# Patient Record
Sex: Male | Born: 1965 | Race: White | Hispanic: No | Marital: Married | Smoking: Current some day smoker
Health system: Southern US, Community
[De-identification: ages and names within clinical notes are randomized; demographics above are authoritative.]

## PROBLEM LIST (undated history)

## (undated) DIAGNOSIS — I10 Essential (primary) hypertension: Secondary | ICD-10-CM

## (undated) DIAGNOSIS — I5032 Chronic diastolic (congestive) heart failure: Secondary | ICD-10-CM

## (undated) DIAGNOSIS — Z9889 Other specified postprocedural states: Principal | ICD-10-CM

## (undated) DIAGNOSIS — G4733 Obstructive sleep apnea (adult) (pediatric): Secondary | ICD-10-CM

## (undated) DIAGNOSIS — E785 Hyperlipidemia, unspecified: Secondary | ICD-10-CM

## (undated) DIAGNOSIS — E119 Type 2 diabetes mellitus without complications: Secondary | ICD-10-CM

## (undated) DIAGNOSIS — I313 Pericardial effusion (noninflammatory): Secondary | ICD-10-CM

## (undated) HISTORY — PX: HERNIA REPAIR: SHX51

## (undated) HISTORY — DX: Chronic diastolic (congestive) heart failure: I50.32

## (undated) HISTORY — DX: Other specified postprocedural states: Z98.890

---

## 2018-12-07 ENCOUNTER — Inpatient Hospital Stay (HOSPITAL_COMMUNITY): Payer: 59

## 2018-12-07 ENCOUNTER — Inpatient Hospital Stay (HOSPITAL_COMMUNITY): Payer: 59 | Admitting: Certified Registered Nurse Anesthetist

## 2018-12-07 ENCOUNTER — Encounter (HOSPITAL_COMMUNITY): Payer: Self-pay | Admitting: Emergency Medicine

## 2018-12-07 ENCOUNTER — Other Ambulatory Visit: Payer: Self-pay

## 2018-12-07 ENCOUNTER — Encounter (HOSPITAL_COMMUNITY)
Admission: EM | Disposition: A | Discharge status: Home / Self Care | Payer: Self-pay | Source: Home / Self Care | Attending: Cardiology

## 2018-12-07 ENCOUNTER — Inpatient Hospital Stay (HOSPITAL_COMMUNITY)
Admission: EM | Admit: 2018-12-07 | Discharge: 2018-12-10 | DRG: 271 | Disposition: A | Discharge status: Home / Self Care | Payer: 59 | Attending: Cardiology | Admitting: Cardiology

## 2018-12-07 DIAGNOSIS — R079 Chest pain, unspecified: Secondary | ICD-10-CM | POA: Diagnosis present

## 2018-12-07 DIAGNOSIS — R0789 Other chest pain: Secondary | ICD-10-CM | POA: Diagnosis present

## 2018-12-07 DIAGNOSIS — E1165 Type 2 diabetes mellitus with hyperglycemia: Secondary | ICD-10-CM | POA: Diagnosis not present

## 2018-12-07 DIAGNOSIS — I2119 ST elevation (STEMI) myocardial infarction involving other coronary artery of inferior wall: Secondary | ICD-10-CM

## 2018-12-07 DIAGNOSIS — I10 Essential (primary) hypertension: Secondary | ICD-10-CM

## 2018-12-07 DIAGNOSIS — I2510 Atherosclerotic heart disease of native coronary artery without angina pectoris: Secondary | ICD-10-CM | POA: Diagnosis present

## 2018-12-07 DIAGNOSIS — I110 Hypertensive heart disease with heart failure: Secondary | ICD-10-CM | POA: Diagnosis present

## 2018-12-07 DIAGNOSIS — I5032 Chronic diastolic (congestive) heart failure: Secondary | ICD-10-CM | POA: Diagnosis present

## 2018-12-07 DIAGNOSIS — Z881 Allergy status to other antibiotic agents status: Secondary | ICD-10-CM

## 2018-12-07 DIAGNOSIS — E669 Obesity, unspecified: Secondary | ICD-10-CM | POA: Diagnosis present

## 2018-12-07 DIAGNOSIS — I314 Cardiac tamponade: Secondary | ICD-10-CM | POA: Diagnosis present

## 2018-12-07 DIAGNOSIS — I3139 Other pericardial effusion (noninflammatory): Secondary | ICD-10-CM

## 2018-12-07 DIAGNOSIS — I313 Pericardial effusion (noninflammatory): Secondary | ICD-10-CM | POA: Diagnosis present

## 2018-12-07 DIAGNOSIS — J9811 Atelectasis: Secondary | ICD-10-CM

## 2018-12-07 DIAGNOSIS — G4733 Obstructive sleep apnea (adult) (pediatric): Secondary | ICD-10-CM | POA: Diagnosis present

## 2018-12-07 DIAGNOSIS — Z79899 Other long term (current) drug therapy: Secondary | ICD-10-CM | POA: Diagnosis not present

## 2018-12-07 DIAGNOSIS — E7800 Pure hypercholesterolemia, unspecified: Secondary | ICD-10-CM

## 2018-12-07 DIAGNOSIS — Z888 Allergy status to other drugs, medicaments and biological substances status: Secondary | ICD-10-CM | POA: Diagnosis not present

## 2018-12-07 DIAGNOSIS — F17200 Nicotine dependence, unspecified, uncomplicated: Secondary | ICD-10-CM | POA: Diagnosis present

## 2018-12-07 DIAGNOSIS — Z6841 Body Mass Index (BMI) 40.0 and over, adult: Secondary | ICD-10-CM | POA: Diagnosis not present

## 2018-12-07 DIAGNOSIS — Z9889 Other specified postprocedural states: Secondary | ICD-10-CM | POA: Insufficient documentation

## 2018-12-07 DIAGNOSIS — E785 Hyperlipidemia, unspecified: Secondary | ICD-10-CM | POA: Diagnosis present

## 2018-12-07 DIAGNOSIS — D72829 Elevated white blood cell count, unspecified: Secondary | ICD-10-CM | POA: Diagnosis present

## 2018-12-07 DIAGNOSIS — E1169 Type 2 diabetes mellitus with other specified complication: Secondary | ICD-10-CM | POA: Diagnosis not present

## 2018-12-07 DIAGNOSIS — Z794 Long term (current) use of insulin: Secondary | ICD-10-CM | POA: Diagnosis not present

## 2018-12-07 DIAGNOSIS — I308 Other forms of acute pericarditis: Secondary | ICD-10-CM | POA: Diagnosis not present

## 2018-12-07 DIAGNOSIS — R072 Precordial pain: Secondary | ICD-10-CM | POA: Diagnosis not present

## 2018-12-07 HISTORY — PX: TEE WITHOUT CARDIOVERSION: SHX5443

## 2018-12-07 HISTORY — DX: Type 2 diabetes mellitus without complications: E11.9

## 2018-12-07 HISTORY — DX: Obstructive sleep apnea (adult) (pediatric): G47.33

## 2018-12-07 HISTORY — PX: LEFT HEART CATH AND CORONARY ANGIOGRAPHY: CATH118249

## 2018-12-07 HISTORY — DX: Hyperlipidemia, unspecified: E78.5

## 2018-12-07 HISTORY — PX: SUBXYPHOID PERICARDIAL WINDOW: SHX5075

## 2018-12-07 HISTORY — DX: Pericardial effusion (noninflammatory): I31.3

## 2018-12-07 HISTORY — DX: Other specified postprocedural states: Z98.890

## 2018-12-07 HISTORY — DX: Other pericardial effusion (noninflammatory): I31.39

## 2018-12-07 HISTORY — DX: Essential (primary) hypertension: I10

## 2018-12-07 HISTORY — DX: Morbid (severe) obesity due to excess calories: E66.01

## 2018-12-07 LAB — POCT I-STAT 3, ART BLOOD GAS (G3+)
Acid-Base Excess: 2 mmol/L (ref 0.0–2.0)
Acid-Base Excess: 1 mmol/L (ref 0.0–2.0)
Bicarbonate: 30 mmol/L — ABNORMAL HIGH (ref 20.0–28.0)
Bicarbonate: 29.5 mmol/L — ABNORMAL HIGH (ref 20.0–28.0)
O2 Saturation: 94 %
O2 Saturation: 91 %
Patient temperature: 98.6
Patient temperature: 98.6
TCO2: 32 mmol/L (ref 22–32)
TCO2: 31 mmol/L (ref 22–32)
pCO2 arterial: 61.4 mmHg — ABNORMAL HIGH (ref 32.0–48.0)
pCO2 arterial: 61 mmHg — ABNORMAL HIGH (ref 32.0–48.0)
pH, Arterial: 7.298 — ABNORMAL LOW (ref 7.350–7.450)
pH, Arterial: 7.292 — ABNORMAL LOW (ref 7.350–7.450)
pO2, Arterial: 80 mmHg — ABNORMAL LOW (ref 83.0–108.0)
pO2, Arterial: 69 mmHg — ABNORMAL LOW (ref 83.0–108.0)

## 2018-12-07 LAB — CBC WITH DIFFERENTIAL/PLATELET
Abs Immature Granulocytes: 0.11 10*3/uL — ABNORMAL HIGH (ref 0.00–0.07)
Basophils Absolute: 0 10*3/uL (ref 0.0–0.1)
Basophils Relative: 0 %
Eosinophils Absolute: 0.2 10*3/uL (ref 0.0–0.5)
Eosinophils Relative: 1 %
HCT: 53.5 % — ABNORMAL HIGH (ref 39.0–52.0)
Hemoglobin: 16.8 g/dL (ref 13.0–17.0)
Immature Granulocytes: 1 %
LYMPHS PCT: 11 %
Lymphs Abs: 1.9 10*3/uL (ref 0.7–4.0)
MCH: 28 pg (ref 26.0–34.0)
MCHC: 31.4 g/dL (ref 30.0–36.0)
MCV: 89.2 fL (ref 80.0–100.0)
Monocytes Absolute: 1.5 10*3/uL — ABNORMAL HIGH (ref 0.1–1.0)
Monocytes Relative: 9 %
Neutro Abs: 13.4 10*3/uL — ABNORMAL HIGH (ref 1.7–7.7)
Neutrophils Relative %: 78 %
Platelets: 237 10*3/uL (ref 150–400)
RBC: 6 MIL/uL — ABNORMAL HIGH (ref 4.22–5.81)
RDW: 15.1 % (ref 11.5–15.5)
WBC: 17.2 10*3/uL — ABNORMAL HIGH (ref 4.0–10.5)
nRBC: 0 % (ref 0.0–0.2)

## 2018-12-07 LAB — BODY FLUID CELL COUNT WITH DIFFERENTIAL
Lymphs, Fluid: 4 %
Monocyte-Macrophage-Serous Fluid: 4 % — ABNORMAL LOW (ref 50–90)
Neutrophil Count, Fluid: 92 % — ABNORMAL HIGH (ref 0–25)
Total Nucleated Cell Count, Fluid: UNDETERMINED cu mm (ref 0–1000)

## 2018-12-07 LAB — I-STAT TROPONIN, ED: Troponin i, poc: 0 ng/mL (ref 0.00–0.08)

## 2018-12-07 LAB — COMPREHENSIVE METABOLIC PANEL
ALT: 26 U/L (ref 0–44)
AST: 25 U/L (ref 15–41)
Albumin: 4.4 g/dL (ref 3.5–5.0)
Alkaline Phosphatase: 49 U/L (ref 38–126)
Anion gap: 12 (ref 5–15)
BUN: 16 mg/dL (ref 6–20)
CO2: 32 mmol/L (ref 22–32)
Calcium: 9.4 mg/dL (ref 8.9–10.3)
Chloride: 96 mmol/L — ABNORMAL LOW (ref 98–111)
Creatinine, Ser: 0.82 mg/dL (ref 0.61–1.24)
GFR calc Af Amer: >60 mL/min (ref >60)
GFR calc non Af Amer: >60 mL/min (ref >60)
Glucose, Bld: 104 mg/dL — ABNORMAL HIGH (ref 70–99)
POTASSIUM: 3.7 mmol/L (ref 3.5–5.1)
SODIUM: 140 mmol/L (ref 135–145)
Total Bilirubin: 0.7 mg/dL (ref 0.3–1.2)
Total Protein: 7.7 g/dL (ref 6.5–8.1)

## 2018-12-07 LAB — PROTIME-INR

## 2018-12-07 LAB — LIPID PANEL
Cholesterol: 145 mg/dL (ref 0–200)
HDL: 42 mg/dL (ref >40)
LDL Cholesterol: 79 mg/dL (ref 0–99)
TRIGLYCERIDES: 120 mg/dL (ref <150)
Total CHOL/HDL Ratio: 3.5 RATIO
VLDL: 24 mg/dL (ref 0–40)

## 2018-12-07 LAB — APTT

## 2018-12-07 LAB — GLUCOSE, CAPILLARY
Glucose-Capillary: 168 mg/dL — ABNORMAL HIGH (ref 70–99)
Glucose-Capillary: 195 mg/dL — ABNORMAL HIGH (ref 70–99)
Glucose-Capillary: 215 mg/dL — ABNORMAL HIGH (ref 70–99)
Glucose-Capillary: 302 mg/dL — ABNORMAL HIGH (ref 70–99)
Glucose-Capillary: 315 mg/dL — ABNORMAL HIGH (ref 70–99)

## 2018-12-07 LAB — ECHOCARDIOGRAM COMPLETE
Height: 69 in
Weight: 6400 oz

## 2018-12-07 LAB — POCT I-STAT, CHEM 8
BUN: 15 mg/dL (ref 6–20)
Calcium, Ion: 1.17 mmol/L (ref 1.15–1.40)
Chloride: 96 mmol/L — ABNORMAL LOW (ref 98–111)
Creatinine, Ser: 0.6 mg/dL — ABNORMAL LOW (ref 0.61–1.24)
Glucose, Bld: 141 mg/dL — ABNORMAL HIGH (ref 70–99)
HEMATOCRIT: 51 % (ref 39.0–52.0)
Hemoglobin: 17.3 g/dL — ABNORMAL HIGH (ref 13.0–17.0)
Potassium: 3.8 mmol/L (ref 3.5–5.1)
Sodium: 138 mmol/L (ref 135–145)
TCO2: 32 mmol/L (ref 22–32)

## 2018-12-07 LAB — TSH: TSH: 2.28 u[IU]/mL (ref 0.350–4.500)

## 2018-12-07 LAB — TROPONIN I
Troponin I: <0.03 ng/mL (ref <0.03)
Troponin I: <0.03 ng/mL (ref <0.03)
Troponin I: <0.03 ng/mL (ref <0.03)

## 2018-12-07 LAB — SEDIMENTATION RATE: Sed Rate: 10 mm/hr (ref 0–16)

## 2018-12-07 LAB — C-REACTIVE PROTEIN: CRP: 0.8 mg/dL (ref <1.0)

## 2018-12-07 LAB — HIV ANTIBODY (ROUTINE TESTING W REFLEX): HIV Screen 4th Generation wRfx: NONREACTIVE

## 2018-12-07 LAB — PREPARE RBC (CROSSMATCH)

## 2018-12-07 LAB — LIPASE, BLOOD: Lipase: 28 U/L (ref 11–51)

## 2018-12-07 LAB — ABO/RH: ABO/RH(D): O POS

## 2018-12-07 LAB — HEMOGLOBIN A1C
Hgb A1c MFr Bld: 7.2 % — ABNORMAL HIGH (ref 4.8–5.6)
Mean Plasma Glucose: 159.94 mg/dL

## 2018-12-07 LAB — BRAIN NATRIURETIC PEPTIDE: B Natriuretic Peptide: 11.9 pg/mL (ref 0.0–100.0)

## 2018-12-07 LAB — MRSA PCR SCREENING: MRSA by PCR: NEGATIVE

## 2018-12-07 SURGERY — CREATION, PERICARDIAL WINDOW, SUBXIPHOID APPROACH
Anesthesia: General | Site: Chest

## 2018-12-07 SURGERY — LEFT HEART CATH AND CORONARY ANGIOGRAPHY
Anesthesia: LOCAL

## 2018-12-07 MED ORDER — HEPARIN SODIUM (PORCINE) 1000 UNIT/ML IJ SOLN
INTRAMUSCULAR | Status: AC
  Filled 2018-12-07: qty 1

## 2018-12-07 MED ORDER — SUCCINYLCHOLINE CHLORIDE 20 MG/ML IJ SOLN
INTRAMUSCULAR | Status: DC | PRN
  Administered 2018-12-07: 140 mg via INTRAVENOUS

## 2018-12-07 MED ORDER — LIDOCAINE HCL (PF) 1 % IJ SOLN
INTRAMUSCULAR | Status: AC
  Filled 2018-12-07: qty 30

## 2018-12-07 MED ORDER — LIDOCAINE HCL (PF) 1 % IJ SOLN
INTRAMUSCULAR | Status: DC | PRN
  Administered 2018-12-07: 2 mL via SUBCUTANEOUS

## 2018-12-07 MED ORDER — ROCURONIUM BROMIDE 50 MG/5ML IV SOSY
PREFILLED_SYRINGE | INTRAVENOUS | Status: AC
  Filled 2018-12-07: qty 20

## 2018-12-07 MED ORDER — ENOXAPARIN SODIUM 100 MG/ML ~~LOC~~ SOLN: 90.0000 mg | Freq: Every day | SUBCUTANEOUS | Status: DC

## 2018-12-07 MED ORDER — LABETALOL HCL 5 MG/ML IV SOLN
INTRAVENOUS | Status: DC | PRN
  Administered 2018-12-07 (×3): 5 mg via INTRAVENOUS

## 2018-12-07 MED ORDER — HEPARIN (PORCINE) IN NACL 1000-0.9 UT/500ML-% IV SOLN
INTRAVENOUS | Status: DC | PRN
  Administered 2018-12-07 (×2): 500 mL

## 2018-12-07 MED ORDER — POTASSIUM CHLORIDE 10 MEQ/50ML IV SOLN: 10.0000 meq | Freq: Every day | INTRAVENOUS | Status: DC | PRN

## 2018-12-07 MED ORDER — ROCURONIUM BROMIDE 100 MG/10ML IV SOLN
INTRAVENOUS | Status: DC | PRN
  Administered 2018-12-07 (×2): 20 mg via INTRAVENOUS

## 2018-12-07 MED ORDER — ACETAMINOPHEN 325 MG PO TABS
650.0000 mg | ORAL_TABLET | ORAL | Status: DC | PRN
  Administered 2018-12-07: 650 mg via ORAL
  Filled 2018-12-07: qty 2

## 2018-12-07 MED ORDER — ALBUTEROL SULFATE (2.5 MG/3ML) 0.083% IN NEBU
2.5000 mg | INHALATION_SOLUTION | Freq: Four times a day (QID) | RESPIRATORY_TRACT | Status: DC | PRN
  Administered 2018-12-07: 2.5 mg via RESPIRATORY_TRACT

## 2018-12-07 MED ORDER — SODIUM CHLORIDE 0.9 % IV SOLN
INTRAVENOUS | Status: AC | PRN
  Administered 2018-12-07: 100 mL/h via INTRAVENOUS

## 2018-12-07 MED ORDER — SODIUM CHLORIDE 0.9 % IV SOLN: 250.0000 mL | INTRAVENOUS | Status: DC | PRN

## 2018-12-07 MED ORDER — ONDANSETRON HCL 4 MG/2ML IJ SOLN
INTRAMUSCULAR | Status: AC
  Filled 2018-12-07: qty 2

## 2018-12-07 MED ORDER — DEXAMETHASONE SODIUM PHOSPHATE 10 MG/ML IJ SOLN
INTRAMUSCULAR | Status: AC
  Filled 2018-12-07: qty 1

## 2018-12-07 MED ORDER — ONDANSETRON HCL 4 MG/2ML IJ SOLN
4.0000 mg | Freq: Once | INTRAMUSCULAR | Status: AC
  Administered 2018-12-07: 4 mg via INTRAVENOUS
  Filled 2018-12-07: qty 2

## 2018-12-07 MED ORDER — VERAPAMIL HCL 2.5 MG/ML IV SOLN
INTRAVENOUS | Status: DC | PRN
  Administered 2018-12-07: 10 mL via INTRA_ARTERIAL

## 2018-12-07 MED ORDER — ROSUVASTATIN CALCIUM 5 MG PO TABS: 10.0000 mg | ORAL_TABLET | Freq: Every evening | ORAL | Status: DC

## 2018-12-07 MED ORDER — ONDANSETRON HCL 4 MG/2ML IJ SOLN: 4.0000 mg | Freq: Four times a day (QID) | INTRAMUSCULAR | Status: DC | PRN

## 2018-12-07 MED ORDER — LACTATED RINGERS IV SOLN
INTRAVENOUS | Status: DC | PRN
  Administered 2018-12-07 (×2): via INTRAVENOUS

## 2018-12-07 MED ORDER — MORPHINE SULFATE (PF) 2 MG/ML IV SOLN
2.0000 mg | INTRAVENOUS | Status: DC | PRN
  Administered 2018-12-07 (×2): 2 mg via INTRAVENOUS
  Filled 2018-12-07 (×2): qty 1

## 2018-12-07 MED ORDER — HEPARIN SODIUM (PORCINE) 1000 UNIT/ML IJ SOLN
INTRAMUSCULAR | Status: DC | PRN
  Administered 2018-12-07: 5000 [IU] via INTRAVENOUS

## 2018-12-07 MED ORDER — IOPAMIDOL (ISOVUE-370) INJECTION 76%
100.0000 mL | Freq: Once | INTRAVENOUS | Status: AC
  Administered 2018-12-07: 100 mL via INTRAVENOUS

## 2018-12-07 MED ORDER — PROPOFOL 10 MG/ML IV BOLUS
INTRAVENOUS | Status: AC
  Filled 2018-12-07: qty 20

## 2018-12-07 MED ORDER — ALBUTEROL SULFATE (2.5 MG/3ML) 0.083% IN NEBU: 3.0000 mL | INHALATION_SOLUTION | Freq: Four times a day (QID) | RESPIRATORY_TRACT | Status: DC | PRN

## 2018-12-07 MED ORDER — MIDAZOLAM HCL 2 MG/2ML IJ SOLN
INTRAMUSCULAR | Status: AC
  Filled 2018-12-07: qty 2

## 2018-12-07 MED ORDER — CARISOPRODOL 350 MG PO TABS: 350.0000 mg | ORAL_TABLET | Freq: Two times a day (BID) | ORAL | Status: DC | PRN

## 2018-12-07 MED ORDER — ADULT MULTIVITAMIN W/MINERALS CH: 1.0000 | ORAL_TABLET | Freq: Every day | ORAL | Status: DC

## 2018-12-07 MED ORDER — CEFAZOLIN SODIUM-DEXTROSE 2-4 GM/100ML-% IV SOLN
2.0000 g | Freq: Three times a day (TID) | INTRAVENOUS | Status: AC
  Administered 2018-12-07 – 2018-12-08 (×2): 2 g via INTRAVENOUS
  Filled 2018-12-07 (×2): qty 100

## 2018-12-07 MED ORDER — ENOXAPARIN SODIUM 40 MG/0.4ML ~~LOC~~ SOLN: 40.0000 mg | SUBCUTANEOUS | Status: DC

## 2018-12-07 MED ORDER — ORAL CARE MOUTH RINSE: 15.0000 mL | Freq: Two times a day (BID) | OROMUCOSAL | Status: DC

## 2018-12-07 MED ORDER — PROPOFOL 10 MG/ML IV BOLUS
INTRAVENOUS | Status: DC | PRN
  Administered 2018-12-07: 80 mg via INTRAVENOUS

## 2018-12-07 MED ORDER — ACETAMINOPHEN 160 MG/5ML PO SOLN: 1000.0000 mg | Freq: Four times a day (QID) | ORAL | Status: DC

## 2018-12-07 MED ORDER — PHENYLEPHRINE 40 MCG/ML (10ML) SYRINGE FOR IV PUSH (FOR BLOOD PRESSURE SUPPORT)
PREFILLED_SYRINGE | INTRAVENOUS | Status: AC
  Filled 2018-12-07: qty 10

## 2018-12-07 MED ORDER — FENTANYL CITRATE (PF) 100 MCG/2ML IJ SOLN
INTRAMUSCULAR | Status: DC | PRN
  Administered 2018-12-07: 50 ug via INTRAVENOUS
  Administered 2018-12-07: 100 ug via INTRAVENOUS
  Administered 2018-12-07: 50 ug via INTRAVENOUS

## 2018-12-07 MED ORDER — 0.9 % SODIUM CHLORIDE (POUR BTL) OPTIME
TOPICAL | Status: DC | PRN
  Administered 2018-12-07: 2000 mL

## 2018-12-07 MED ORDER — INSULIN ASPART 100 UNIT/ML ~~LOC~~ SOLN
SUBCUTANEOUS | Status: AC
  Filled 2018-12-07: qty 1

## 2018-12-07 MED ORDER — SODIUM CHLORIDE 0.9% FLUSH
3.0000 mL | Freq: Two times a day (BID) | INTRAVENOUS | Status: DC
  Administered 2018-12-07: 3 mL via INTRAVENOUS

## 2018-12-07 MED ORDER — EPHEDRINE SULFATE 50 MG/ML IJ SOLN
INTRAMUSCULAR | Status: DC | PRN
  Administered 2018-12-07: 5 mg via INTRAVENOUS

## 2018-12-07 MED ORDER — PANTOPRAZOLE SODIUM 40 MG PO TBEC
40.0000 mg | DELAYED_RELEASE_TABLET | Freq: Every day | ORAL | Status: DC
  Administered 2018-12-07 – 2018-12-10 (×4): 40 mg via ORAL
  Filled 2018-12-07 (×4): qty 1

## 2018-12-07 MED ORDER — INSULIN ASPART 100 UNIT/ML ~~LOC~~ SOLN: 5.0000 [IU] | Freq: Once | SUBCUTANEOUS | Status: DC

## 2018-12-07 MED ORDER — SENNOSIDES-DOCUSATE SODIUM 8.6-50 MG PO TABS
1.0000 | ORAL_TABLET | Freq: Every day | ORAL | Status: DC
  Administered 2018-12-07 – 2018-12-08 (×2): 1 via ORAL
  Filled 2018-12-07 (×2): qty 1

## 2018-12-07 MED ORDER — ASPIRIN 81 MG PO CHEW
81.0000 mg | CHEWABLE_TABLET | Freq: Every day | ORAL | Status: DC
  Filled 2018-12-07: qty 1

## 2018-12-07 MED ORDER — SUGAMMADEX SODIUM 500 MG/5ML IV SOLN
INTRAVENOUS | Status: DC | PRN
  Administered 2018-12-07: 400 mg via INTRAVENOUS

## 2018-12-07 MED ORDER — SUGAMMADEX SODIUM 500 MG/5ML IV SOLN
INTRAVENOUS | Status: AC
  Filled 2018-12-07: qty 5

## 2018-12-07 MED ORDER — SODIUM CHLORIDE 0.9% IV SOLUTION: Freq: Once | INTRAVENOUS | Status: DC

## 2018-12-07 MED ORDER — ALBUTEROL SULFATE (2.5 MG/3ML) 0.083% IN NEBU
2.5000 mg | INHALATION_SOLUTION | RESPIRATORY_TRACT | Status: DC
  Administered 2018-12-07 – 2018-12-08 (×3): 2.5 mg via RESPIRATORY_TRACT
  Filled 2018-12-07 (×2): qty 3

## 2018-12-07 MED ORDER — OXYCODONE HCL 5 MG PO TABS
5.0000 mg | ORAL_TABLET | ORAL | Status: DC | PRN
  Administered 2018-12-07: 10 mg via ORAL
  Administered 2018-12-08 – 2018-12-09 (×2): 5 mg via ORAL
  Filled 2018-12-07 (×2): qty 1
  Filled 2018-12-07: qty 2

## 2018-12-07 MED ORDER — FENTANYL CITRATE (PF) 250 MCG/5ML IJ SOLN
INTRAMUSCULAR | Status: AC
  Filled 2018-12-07: qty 5

## 2018-12-07 MED ORDER — KETOROLAC TROMETHAMINE 30 MG/ML IJ SOLN
30.0000 mg | Freq: Four times a day (QID) | INTRAMUSCULAR | Status: DC
  Administered 2018-12-07: 30 mg via INTRAVENOUS
  Filled 2018-12-07: qty 1

## 2018-12-07 MED ORDER — SODIUM CHLORIDE 0.9 % IV SOLN
INTRAVENOUS | Status: DC
  Administered 2018-12-07: 08:00:00 via INTRAVENOUS

## 2018-12-07 MED ORDER — ROCURONIUM BROMIDE 50 MG/5ML IV SOSY
PREFILLED_SYRINGE | INTRAVENOUS | Status: AC
  Filled 2018-12-07: qty 5

## 2018-12-07 MED ORDER — BISACODYL 5 MG PO TBEC
10.0000 mg | DELAYED_RELEASE_TABLET | Freq: Every day | ORAL | Status: DC
  Administered 2018-12-08 – 2018-12-09 (×2): 10 mg via ORAL
  Filled 2018-12-07 (×3): qty 2

## 2018-12-07 MED ORDER — ACETAMINOPHEN 500 MG PO TABS
1000.0000 mg | ORAL_TABLET | Freq: Four times a day (QID) | ORAL | Status: DC
  Administered 2018-12-07 – 2018-12-08 (×2): 1000 mg via ORAL
  Filled 2018-12-07 (×2): qty 2

## 2018-12-07 MED ORDER — FENTANYL CITRATE (PF) 100 MCG/2ML IJ SOLN
50.0000 ug | Freq: Once | INTRAMUSCULAR | Status: AC
  Administered 2018-12-07: 50 ug via INTRAVENOUS
  Filled 2018-12-07: qty 2

## 2018-12-07 MED ORDER — ASPIRIN 81 MG PO CHEW
324.0000 mg | CHEWABLE_TABLET | Freq: Once | ORAL | Status: AC
  Administered 2018-12-07: 324 mg via ORAL
  Filled 2018-12-07: qty 4

## 2018-12-07 MED ORDER — SODIUM CHLORIDE 0.9 % IV SOLN
INTRAVENOUS | Status: DC | PRN
  Administered 2018-12-07: 25 ug/min via INTRAVENOUS

## 2018-12-07 MED ORDER — VANCOMYCIN HCL 10 G IV SOLR
1500.0000 mg | INTRAVENOUS | Status: AC
  Administered 2018-12-07 (×2): 1500 mg via INTRAVENOUS
  Filled 2018-12-07: qty 1500

## 2018-12-07 MED ORDER — SUCCINYLCHOLINE CHLORIDE 200 MG/10ML IV SOSY
PREFILLED_SYRINGE | INTRAVENOUS | Status: AC
  Filled 2018-12-07: qty 10

## 2018-12-07 MED ORDER — TRAMADOL HCL 50 MG PO TABS: 50.0000 mg | ORAL_TABLET | Freq: Four times a day (QID) | ORAL | Status: DC | PRN

## 2018-12-07 MED ORDER — HEPARIN (PORCINE) IN NACL 1000-0.9 UT/500ML-% IV SOLN
INTRAVENOUS | Status: AC
  Filled 2018-12-07: qty 1000

## 2018-12-07 MED ORDER — LIDOCAINE 2% (20 MG/ML) 5 ML SYRINGE
INTRAMUSCULAR | Status: AC
  Filled 2018-12-07: qty 5

## 2018-12-07 MED ORDER — VANCOMYCIN HCL IN DEXTROSE 1-5 GM/200ML-% IV SOLN
1000.0000 mg | Freq: Two times a day (BID) | INTRAVENOUS | Status: AC
  Administered 2018-12-07: 1000 mg via INTRAVENOUS
  Filled 2018-12-07: qty 200

## 2018-12-07 MED ORDER — DEXAMETHASONE SODIUM PHOSPHATE 4 MG/ML IJ SOLN
INTRAMUSCULAR | Status: DC | PRN
  Administered 2018-12-07: 10 mg via INTRAVENOUS

## 2018-12-07 MED ORDER — PHENYLEPHRINE HCL 10 MG/ML IJ SOLN
INTRAMUSCULAR | Status: DC | PRN
  Administered 2018-12-07: 120 ug via INTRAVENOUS
  Administered 2018-12-07: 160 ug via INTRAVENOUS
  Administered 2018-12-07: 80 ug via INTRAVENOUS

## 2018-12-07 MED ORDER — SODIUM CHLORIDE 0.9 % IV SOLN
INTRAVENOUS | Status: DC
  Administered 2018-12-07 – 2018-12-08 (×2): via INTRAVENOUS

## 2018-12-07 MED ORDER — ALPRAZOLAM 0.5 MG PO TABS
0.5000 mg | ORAL_TABLET | Freq: Every evening | ORAL | Status: DC | PRN
  Administered 2018-12-07: 0.5 mg via ORAL
  Filled 2018-12-07: qty 1

## 2018-12-07 MED ORDER — IOHEXOL 350 MG/ML SOLN
INTRAVENOUS | Status: DC | PRN
  Administered 2018-12-07: 90 mL via INTRA_ARTERIAL

## 2018-12-07 MED ORDER — ALBUTEROL SULFATE (2.5 MG/3ML) 0.083% IN NEBU
INHALATION_SOLUTION | RESPIRATORY_TRACT | Status: AC
  Filled 2018-12-07: qty 3

## 2018-12-07 MED ORDER — INSULIN ASPART 100 UNIT/ML ~~LOC~~ SOLN
0.0000 [IU] | Freq: Three times a day (TID) | SUBCUTANEOUS | Status: DC
  Administered 2018-12-07 (×2): 2 [IU] via SUBCUTANEOUS

## 2018-12-07 MED ORDER — MORPHINE SULFATE (PF) 2 MG/ML IV SOLN: 2.0000 mg | INTRAVENOUS | Status: DC | PRN

## 2018-12-07 MED ORDER — CARVEDILOL 6.25 MG PO TABS
6.2500 mg | ORAL_TABLET | Freq: Two times a day (BID) | ORAL | Status: DC
  Administered 2018-12-07 – 2018-12-08 (×3): 6.25 mg via ORAL
  Filled 2018-12-07 (×3): qty 1

## 2018-12-07 MED ORDER — SODIUM CHLORIDE 0.9% FLUSH: 3.0000 mL | INTRAVENOUS | Status: DC | PRN

## 2018-12-07 MED ORDER — ATORVASTATIN CALCIUM 80 MG PO TABS: 80.0000 mg | ORAL_TABLET | Freq: Every day | ORAL | Status: DC

## 2018-12-07 MED ORDER — NITROGLYCERIN 1 MG/10 ML FOR IR/CATH LAB
INTRA_ARTERIAL | Status: AC
  Filled 2018-12-07: qty 10

## 2018-12-07 MED ORDER — VERAPAMIL HCL 2.5 MG/ML IV SOLN
INTRAVENOUS | Status: AC
  Filled 2018-12-07: qty 2

## 2018-12-07 MED ORDER — NITROGLYCERIN 0.4 MG SL SUBL: 0.4000 mg | SUBLINGUAL_TABLET | SUBLINGUAL | Status: DC | PRN

## 2018-12-07 MED ORDER — ETOMIDATE 2 MG/ML IV SOLN
INTRAVENOUS | Status: DC | PRN
  Administered 2018-12-07: 20 mg via INTRAVENOUS

## 2018-12-07 MED ORDER — METOPROLOL SUCCINATE ER 25 MG PO TB24: 50.0000 mg | ORAL_TABLET | Freq: Every day | ORAL | Status: DC

## 2018-12-07 MED ORDER — MIDAZOLAM HCL 5 MG/5ML IJ SOLN
INTRAMUSCULAR | Status: DC | PRN
  Administered 2018-12-07: 2 mg via INTRAVENOUS

## 2018-12-07 MED ORDER — COLCHICINE 0.6 MG PO TABS
0.6000 mg | ORAL_TABLET | Freq: Two times a day (BID) | ORAL | Status: DC
  Administered 2018-12-07 – 2018-12-10 (×7): 0.6 mg via ORAL
  Filled 2018-12-07 (×9): qty 1

## 2018-12-07 MED ORDER — INSULIN ASPART 100 UNIT/ML ~~LOC~~ SOLN
0.0000 [IU] | SUBCUTANEOUS | Status: DC
  Administered 2018-12-07 (×2): 16 [IU] via SUBCUTANEOUS
  Administered 2018-12-08: 12 [IU] via SUBCUTANEOUS
  Administered 2018-12-08 (×4): 8 [IU] via SUBCUTANEOUS
  Administered 2018-12-08 – 2018-12-09 (×2): 4 [IU] via SUBCUTANEOUS
  Administered 2018-12-09: 8 [IU] via SUBCUTANEOUS
  Administered 2018-12-09 (×3): 4 [IU] via SUBCUTANEOUS
  Administered 2018-12-10: 2 [IU] via SUBCUTANEOUS
  Administered 2018-12-10: 4 [IU] via SUBCUTANEOUS

## 2018-12-07 MED ORDER — SODIUM CHLORIDE 0.9 % IV SOLN
INTRAVENOUS | Status: DC
  Administered 2018-12-07: 06:00:00 via INTRAVENOUS

## 2018-12-07 MED ORDER — HEPARIN SODIUM (PORCINE) 5000 UNIT/ML IJ SOLN
4000.0000 [IU] | Freq: Once | INTRAMUSCULAR | Status: AC
  Administered 2018-12-07: 4000 [IU] via INTRAVENOUS
  Filled 2018-12-07: qty 0.8

## 2018-12-07 SURGICAL SUPPLY — 48 items
BENZOIN TINCTURE PRP APPL 2/3 (GAUZE/BANDAGES/DRESSINGS) IMPLANT
CANISTER SUCT 3000ML PPV (MISCELLANEOUS) ×3 IMPLANT
CATH THORACIC 28FR (CATHETERS) IMPLANT
CATH THORACIC 28FR RT ANG (CATHETERS) IMPLANT
CATH THORACIC 36FR (CATHETERS) IMPLANT
CATH THORACIC 36FR RT ANG (CATHETERS) IMPLANT
CLIP VESOCCLUDE MED 24/CT (CLIP) ×3 IMPLANT
CLIP VESOCCLUDE SM WIDE 24/CT (CLIP) ×3 IMPLANT
CONT SPEC 4OZ CLIKSEAL STRL BL (MISCELLANEOUS) ×3 IMPLANT
COVER SURGICAL LIGHT HANDLE (MISCELLANEOUS) ×3 IMPLANT
COVER WAND RF STERILE (DRAPES) IMPLANT
DRAIN CHANNEL 15F RND FF W/TCR (WOUND CARE) ×3 IMPLANT
DRAIN CHANNEL 28F RND 3/8 FF (WOUND CARE) IMPLANT
DRAIN CHANNEL 32F RND 10.7 FF (WOUND CARE) ×3 IMPLANT
DRAPE LAPAROSCOPIC ABDOMINAL (DRAPES) ×3 IMPLANT
ELECT BLADE 6.5 EXT (BLADE) ×3 IMPLANT
ELECT REM PT RETURN 9FT ADLT (ELECTROSURGICAL) ×3
ELECTRODE REM PT RTRN 9FT ADLT (ELECTROSURGICAL) ×2 IMPLANT
GAUZE SPONGE 4X4 12PLY STRL (GAUZE/BANDAGES/DRESSINGS) ×3 IMPLANT
GAUZE SPONGE 4X4 12PLY STRL LF (GAUZE/BANDAGES/DRESSINGS) ×3 IMPLANT
GLOVE ORTHO TXT STRL SZ7.5 (GLOVE) ×6 IMPLANT
GOWN STRL REUS W/ TWL XL LVL3 (GOWN DISPOSABLE) ×10 IMPLANT
GOWN STRL REUS W/TWL XL LVL3 (GOWN DISPOSABLE) ×5
HEMOSTAT POWDER SURGIFOAM 1G (HEMOSTASIS) IMPLANT
KIT BASIN OR (CUSTOM PROCEDURE TRAY) ×3 IMPLANT
KIT PREVENA INCISION MGT 13 (CANNISTER) ×3 IMPLANT
KIT SUCTION CATH 14FR (SUCTIONS) ×3 IMPLANT
KIT TURNOVER KIT B (KITS) ×3 IMPLANT
MARKER SKIN DUAL TIP RULER LAB (MISCELLANEOUS) ×3 IMPLANT
NS IRRIG 1000ML POUR BTL (IV SOLUTION) ×3 IMPLANT
PACK CHEST (CUSTOM PROCEDURE TRAY) IMPLANT
PAD ARMBOARD 7.5X6 YLW CONV (MISCELLANEOUS) ×6 IMPLANT
PAD ELECT DEFIB RADIOL ZOLL (MISCELLANEOUS) ×3 IMPLANT
STRIP CLOSURE SKIN 1/2X4 (GAUZE/BANDAGES/DRESSINGS) ×3 IMPLANT
STRIP CLOSURE SKIN 1/4X3 (GAUZE/BANDAGES/DRESSINGS) ×3 IMPLANT
SUT MNCRL AB 3-0 PS2 18 (SUTURE) ×3 IMPLANT
SUT VIC AB 1 CTX 18 (SUTURE) ×9 IMPLANT
SUT VIC AB 2-0 CT1 36 (SUTURE) ×3 IMPLANT
SWAB COLLECTION DEVICE MRSA (MISCELLANEOUS) IMPLANT
SWAB CULTURE ESWAB REG 1ML (MISCELLANEOUS) IMPLANT
SYR 50ML SLIP (SYRINGE) IMPLANT
SYSTEM SAHARA CHEST DRAIN ATS (WOUND CARE) ×3 IMPLANT
TOWEL GREEN STERILE (TOWEL DISPOSABLE) ×3 IMPLANT
TOWEL GREEN STERILE FF (TOWEL DISPOSABLE) ×3 IMPLANT
TRAP SPECIMEN MUCOUS 40CC (MISCELLANEOUS) ×9 IMPLANT
TRAY FOLEY MTR SLVR 16FR STAT (SET/KITS/TRAYS/PACK) ×3 IMPLANT
WATER STERILE IRR 1000ML POUR (IV SOLUTION) ×3 IMPLANT
YANKAUER SUCT BULB TIP NO VENT (SUCTIONS) ×3 IMPLANT

## 2018-12-07 SURGICAL SUPPLY — 14 items
CATH INFINITI 5 FR JL3.5 (CATHETERS) ×2 IMPLANT
CATH INFINITI 5FR ANG PIGTAIL (CATHETERS) ×2 IMPLANT
CATH INFINITI JR4 5F (CATHETERS) ×2 IMPLANT
DEVICE RAD COMP TR BAND LRG (VASCULAR PRODUCTS) ×2 IMPLANT
ELECT DEFIB PAD ADLT CADENCE (PAD) ×2 IMPLANT
GLIDESHEATH SLEND SS 6F .021 (SHEATH) ×2 IMPLANT
GUIDEWIRE INQWIRE 1.5J.035X260 (WIRE) ×1 IMPLANT
INQWIRE 1.5J .035X260CM (WIRE) ×2
KIT ENCORE 26 ADVANTAGE (KITS) ×2 IMPLANT
KIT HEART LEFT (KITS) ×2 IMPLANT
PACK CARDIAC CATHETERIZATION (CUSTOM PROCEDURE TRAY) ×2 IMPLANT
SYR MEDRAD MARK 7 150ML (SYRINGE) ×2 IMPLANT
TRANSDUCER W/STOPCOCK (MISCELLANEOUS) ×2 IMPLANT
TUBING CIL FLEX 10 FLL-RA (TUBING) ×2 IMPLANT

## 2018-12-07 NOTE — Anesthesia Preprocedure Evaluation (Signed)
Anesthesia Evaluation  Patient identified by MRN, date of birth, ID band Patient awake    Reviewed: Allergy & Precautions, H&P , NPO status , Patient's Chart, lab work & pertinent test results  Airway Mallampati: II   Neck ROM: full    Dental   Pulmonary sleep apnea , Current Smoker,    breath sounds clear to auscultation       Cardiovascular hypertension,  Rhythm:regular Rate:Normal  Pericardial effusion   Neuro/Psych    GI/Hepatic   Endo/Other  diabetes, Type obesity  Renal/GU      Musculoskeletal   Abdominal   Peds  Hematology   Anesthesia Other Findings   Reproductive/Obstetrics                             Anesthesia Physical Anesthesia Plan  ASA: III and emergent  Anesthesia Plan: General   Post-op Pain Management:    Induction: Intravenous  PONV Risk Score and Plan: 1 and Ondansetron, Dexamethasone, Midazolam and Treatment may vary due to age or medical condition  Airway Management Planned: Oral ETT  Additional Equipment: Arterial line, CVP, TEE and Ultrasound Guidance Line Placement  Intra-op Plan:   Post-operative Plan: Post-operative intubation/ventilation  Informed Consent: I have reviewed the patients History and Physical, chart, labs and discussed the procedure including the risks, benefits and alternatives for the proposed anesthesia with the patient or authorized representative who has indicated his/her understanding and acceptance.     Plan Discussed with: CRNA, Anesthesiologist and Surgeon  Anesthesia Plan Comments:         Anesthesia Quick Evaluation

## 2018-12-07 NOTE — ED Notes (Signed)
[facility] arrived for pt transport.  

## 2018-12-07 NOTE — Progress Notes (Signed)
Pt admitted with chest pain, dyspnea and EKG changes. Emergent cardiac with no evidence of CAD. Suspicion for pericarditis. Chest CTA with no evidence of aortic dissection. Pericardial effusion noted on CTA. Echo this am with moderate to large pericardial effusion with early tamponade features. He is tachycardic with ongoing mild dyspnea. Also with ongoing chest pain.   He will need removal of the pericardial effusion. I do not think he is a good candidate for percutaneous pericardiocentesis given his morbid obesity. I will ask CT surgery to see him to discuss a pericardial window for removal of fluid (therapeutic) and diagnostic purposes. He is clinically stable at this time so this procedure is not emergent but given early clinical and echo features of tamponade, I think the procedure is indicated.   Cody CarrowChristopher Erickson 12/07/2018 11:34 AM

## 2018-12-07 NOTE — Progress Notes (Signed)
Increased IPAP on bipap to 22cm to give a larger tidal volume.

## 2018-12-07 NOTE — H&P (Addendum)
Cardiology History & Physical    Patient ID: Cody Erickson MRN: [id number], DOB: 06/25/1966 Date of Encounter: 12/07/2018, 7:06 AM Primary Physician: Janece Canterbury, MD Primary Cardiologist: No primary care provider on file. New to Dr. Swaziland  Chief Complaint: chest pain Reason for Admission: chest pain Requesting MD: Dr Elesa Massed  HPI: Cody Erickson is a 52 y.o. male with history of DM, HTN, HLD, super morbid obesity, OSA (does not use CPAP) who was transferred from [facility] to [facility] for emergent cath due to concern for STEMI. He has a family history of aortic dissection. He has no prior cardiac history otherwise. OP notes indicate poor exercise and diet habits with previously uncontrolled A1C. He smokes rarely. He developed acute onset of substernal chest pain radiating to neck and back around midnight. This was associated with SOB. It did not abate so he came to [facility] ER where EKG showed nonspecific ST elevation inferiorly and V4-V6. No reciprocal changes were noted. Initial labs showed negative troponin but did indicate leukocytosis with WBC 17.2. He was taken emergently to the cath lab. Formal report is pending but per Dr. Swaziland there was no significant coronary disease to explain pain. The patient continued to have chest pain worse when lying down so Dr. Swaziland has recommended CT chest/abd/pelvis and if negative to treat for probable pericarditis.  Past Medical History:  Diagnosis Date  . Diabetes mellitus without complication (HCC)   . Hyperlipidemia   . Hypertension   . Morbid obesity (HCC)   . OSA (obstructive sleep apnea)      Surgical History:  Past Surgical History:  Procedure Laterality Date  . HERNIA REPAIR       Home Meds: Prior to Admission medications   Not on File    Allergies:  Allergies  Allergen Reactions  . Amoxicillin Hives    Social History   Socioeconomic History  . Marital status: Married    Spouse name: Not on file  . Number of children: Not on file  .  Years of education: Not on file  . Highest education level: Not on file  Occupational History  . Not on file  Social Needs  . Financial resource strain: Not on file  . Food insecurity:    Worry: Not on file    Inability: Not on file  . Transportation needs:    Medical: Not on file    Non-medical: Not on file  Tobacco Use  . Smoking status: Current Some Day Smoker  . Smokeless tobacco: Never Used  Substance and Sexual Activity  . Alcohol use: Not Currently    Frequency: Never  . Drug use: Never  . Sexual activity: Not on file  Lifestyle  . Physical activity:    Days per week: Not on file    Minutes per session: Not on file  . Stress: Not on file  Relationships  . Social connections:    Talks on phone: Not on file    Gets together: Not on file    Attends religious service: Not on file    Active member of club or organization: Not on file    Attends meetings of clubs or organizations: Not on file    Relationship status: Not on file  . Intimate partner violence:    Fear of current or ex partner: Not on file    Emotionally abused: Not on file    Physically abused: Not on file    Forced sexual activity: Not on file  Other Topics Concern  .  Not on file  Social History Narrative  . Not on file     Family History  Problem Relation Age of Onset  . Aortic dissection Father     Review of Systems: All other systems reviewed and are otherwise negative except as noted above.  Labs:   Lab Results  Component Value Date   WBC 17.2 (H) 12/07/2018   HGB 16.8 12/07/2018   HCT 53.5 (H) 12/07/2018   MCV 89.2 12/07/2018   PLT 237 12/07/2018    Recent Labs  Lab 12/07/18 0526  NA 140  K 3.7  CL 96*  CO2 32  BUN 16  CREATININE 0.82  CALCIUM 9.4  PROT 7.7  BILITOT 0.7  ALKPHOS 49  ALT 26  AST 25  GLUCOSE 104*   Recent Labs    12/07/18 0526  TROPONINI <0.03   Lab Results  Component Value Date   CHOL 145 12/07/2018   HDL 42 12/07/2018   LDLCALC 79 12/07/2018     TRIG 120 12/07/2018   No results found for: DDIMER  Radiology/Studies:  No results found. Wt Readings from Last 3 Encounters:  12/07/18 (!) 181.4 kg    EKG: NSR with subtle inferior ST upsloping as well as V4-V6. No prior on file  Physical Exam Blood pressure (!) 157/105, pulse (!) 113, temperature 97.8 F (36.6 C), temperature source Oral, resp. rate (!) 41, height 5\' 9"  (1.753 m), weight (!) 181.4 kg, SpO2 93 %. Body mass index is 59.07 kg/m. General: Morbidly obese WM in no acute distress. Head: Normocephalic, atraumatic, sclera non-icteric, no xanthomas, nares are without discharge.  Neck: Negative for carotid bruits. JVD not elevated. Lungs: Clear bilaterally to auscultation without wheezes, rales, or rhonchi. Breathing is unlabored. Heart: Reg rhythm tachycardic with S1 S2. No murmurs, rubs, or gallops appreciated. Abdomen: Soft, non-tender with normoactive bowel sounds. No hepatomegaly. No rebound/guarding. No obvious abdominal masses. Absence of umbilicus due to prior surgery. Large pannus Msk:  Strength and tone appear normal for age. Extremities: No clubbing or cyanosis. Chronic appearing BLE edema.  Distal pedal pulses are 2+ and equal bilaterally. Neuro: Alert and oriented X 3. No focal deficit. No facial asymmetry. Moves all extremities spontaneously. Psych:  Responds to questions appropriately with a normal affect.   Patient was seen and examined by Dr. Swaziland; exam and history outlined per our discussion above.  Assessment and Plan   1. Acute chest pain - cath reportedly negative for culprit; CT chest/abd/pelvis ordered to eval for dissection. Dr. Swaziland recommends initiation of colchicine. Will await CT before starting anti-inflammatories. Check echo.  2. HTN - pending med rec. Follow.  3. Hyperlipidemia - check lipids. Anticipate continuing home statin for now once clarified.  4. Diabetes mellitus - will clarify home meds. Notes outline previous intolerance to  metformin. Add SSI.  5. Tobacco abuse - cessation advised.  6. Leukocytosis - no acute infective sx, follow. ? Stress demargination vs related to pericarditis. Await CT.  7. Severe morbid obesity with OSA - suggest referral to [facility] [place].  Severity of Illness: The appropriate patient status for this patient is INPATIENT. Inpatient status is judged to be reasonable and necessary in order to provide the required intensity of service to ensure the patient's safety. The patient's presenting symptoms, physical exam findings, and initial radiographic and laboratory data in the context of their chronic comorbidities is felt to place them at high risk for further clinical deterioration. Furthermore, it is not anticipated that  the patient will be medically stable for discharge from the hospital within 2 midnights of admission. The following factors support the patient status of inpatient.   " The patient's presenting symptoms include severe chest pain. " The worrisome physical exam findings include morbid obesity. " The initial radiographic and laboratory data are worrisome because of abnormal ekg. " The chronic co-morbidities include listed above, dm, htn, hld, morbid obesity, osa.   * I certify that at the point of admission it is my clinical judgment that the patient will require inpatient hospital care spanning beyond 2 midnights from the point of admission due to high intensity of service, high risk for further deterioration and high frequency of surveillance required.*    For questions or updates, please contact [facility] Please consult [email or web address] for contact info under Cardiology/STEMI.  Signed, Laurann Montanaayna N Tobi Groesbeck, PA-C 12/07/2018, 7:06 AM

## 2018-12-07 NOTE — Progress Notes (Addendum)
Since patient will be NPO for now will hold off on resuming scheduled insulin for now but will continue SSI. Can accelerate regimen if needed. He was on HCTZ, losartan and carvedilol 25mg  BID as OP. We will resume lower dose carvedilol so as to avoid precipitating beta blocker withdrawal, but hold off on losartan and HCTZ for now. I spoke directly with Darius Bumpyan Brooks to facilitate TCTS eval for pericardial window.   Addendum: received word from CVTS coordinator that they will be posting this patient for pericardial window this afternoon with Dr. Cornelius Moraswen, formal consultation pending. I updated nurse. Dayna Dunn PA-C

## 2018-12-07 NOTE — ED Notes (Signed)
Code STEMI called by Dr.Ward.

## 2018-12-07 NOTE — Anesthesia Procedure Notes (Signed)
Arterial Line Insertion Start/End12/10/2018 3:42 PM, 12/07/2018 3:50 PM Performed by: Achille RichHodierne, Adam, MD, anesthesiologist  Preanesthetic checklist: patient identified, IV checked, site marked, risks and benefits discussed, surgical consent, monitors and equipment checked, pre-op evaluation and timeout performed Lidocaine 1% used for infiltration radial was placed Catheter size: 20 G Seldinger technique used Allen's test indicative of satisfactory collateral circulation Attempts: 3 Procedure performed using ultrasound guided technique. Following insertion, dressing applied and Biopatch. Post procedure assessment: normal  Patient tolerated the procedure well with no immediate complications.

## 2018-12-07 NOTE — Progress Notes (Signed)
TCTS BRIEF SICU PROGRESS NOTE  Day of Surgery  S/P Procedure(s) (LRB): SUBXYPHOID PERICARDIAL WINDOW (N/A) TRANSESOPHAGEAL ECHOCARDIOGRAM (TEE) (N/A)   Awake and alert Mild soreness No SOB Sinus tach w/ stable BP Breathing comfortably w/ O2 sats 91% on 5 L/min Minimal chest tube output  Plan: Continue routine early postop  [name], MD 12/07/2018 7:25 PM

## 2018-12-07 NOTE — Consult Note (Addendum)
[address]       [place] [address]             [phone]        [facility] Medical Record #[id number] Date of Birth: 1966/06/17  Referring: Dr. Peter Swaziland, MD Primary Care: Janece Canterbury, MD Primary Cardiologist:No primary care provider on file.  Chief Complaint:    Chief Complaint  Patient presents with  . Chest Pain radiating to back between shoulder blades and shortness of breath    History of Present Illness:     This is a 52 year old Caucasian male, with a past medical history of morbid obesity, hypertension, hyperlipidemia, diabetes mellitus, and OSA,who presented initially to [facility] earlier this am with a chief complaint of chest pain that radiated to his back (between his shoulder blades). Associated with this was shortness of breath. Patient states he thought he was having a heart attack. Troponin I has been 0.03 or less. Initial EKG was concerning for a myocardial infarction. ED physician discussed with Dr. Swaziland and he was in agreement that a code STEMI be activated and patient was urgently transferred to [facility] for further evaluation and treatment. Echo done showed LVEF 65-70%, no significant valvular disease, moderate, free-flowing pericardial effusion was identified circumferential to the heart. The right ventricle mid to apical free wall demonstrates invagination suggestive of increased intrapericardial pressure. Cardiac catheterization done today showed normal LVEF of 55-65% and mild nonobstructive CAD with calcification. An urgent cardiothoracic consultation was requested for consideration of subxiphoid pericardial window.   Current Activity/ Functional Status: Patient is independent with mobility/ambulation, transfers, ADL's, IADL's.   Zubrod Score: At the time of surgery this patient's most appropriate activity status/level should be described as: []     0    Normal activity, no symptoms [x]     1     Restricted in physical strenuous activity but ambulatory, able to do out light work []     2    Ambulatory and capable of self care, unable to do work activities, up and about  more than 50%  of the time                        []     3    Only limited self care, in bed greater than 50% of waking hours []     4    Completely disabled, no self care, confined to bed or chair []     5    Moribund  Past Medical History:  Diagnosis Date  . Diabetes mellitus without complication (HCC)   . Hyperlipidemia   . Hypertension   . Morbid obesity (HCC)   . OSA (obstructive sleep apnea)     Past Surgical History:  Procedure Laterality Date  . HERNIA REPAIR    . LEFT HEART CATH AND CORONARY ANGIOGRAPHY N/A 12/07/2018   Procedure: LEFT HEART CATH AND CORONARY ANGIOGRAPHY;  Surgeon: Swaziland, Peter M, MD;  Location: [facility] INVASIVE CV LAB;  Service: Cardiovascular;  Laterality: N/A;    Social History   Tobacco Use  Smoking Status Current Some Day Smoker  Smokeless Tobacco Never Used    Social History   Substance and Sexual Activity  Alcohol Use Not Currently  . Frequency: Never  Patient is a [employer] for [employer]. He spends most of his time sitting at a desk.   Allergies  Allergen Reactions  . Amoxicillin  Hives  . Metformin And Related     sick  . Simvastatin-High Dose     Elevated LFTs  . Canagliflozin Diarrhea    (invokana)  . Exenatide Nausea Only    (byetta)  . Gemfibrozil Other (See Comments)    Current Facility-Administered Medications  Medication Dose Route Frequency Provider Last Rate Last Dose  . 0.9 %  sodium chloride infusion   Intravenous Continuous Dunn, Dayna N, PA-C 20 mL/hr at 12/07/18 0539    . 0.9 %  sodium chloride infusion   Intravenous Continuous Dunn, Dayna N, PA-C 50 mL/hr at 12/07/18 1200    . 0.9 %  sodium chloride infusion  250 mL Intravenous PRN Dunn, Dayna N, PA-C      . acetaminophen (TYLENOL) tablet 650 mg  650 mg Oral Q4H PRN Dunn, Dayna  N, PA-C   650 mg at 12/07/18 1147  . albuterol (PROVENTIL) (2.5 MG/3ML) 0.083% nebulizer solution 3 mL  3 mL Inhalation Q6H PRN Dunn, Dayna N, PA-C      . ALPRAZolam Prudy Feeler) tablet 0.5 mg  0.5 mg Oral QHS PRN Dunn, Dayna N, PA-C   0.5 mg at 12/07/18 1147  . carisoprodol (SOMA) tablet 350 mg  350 mg Oral BID PRN Dunn, Dayna N, PA-C      . carvedilol (COREG) tablet 6.25 mg  6.25 mg Oral BID WC Dunn, Dayna N, PA-C   6.25 mg at 12/07/18 1224  . colchicine tablet 0.6 mg  0.6 mg Oral BID Dunn, Dayna N, PA-C   0.6 mg at 12/07/18 0902  . [START ON 12/08/2018] enoxaparin (LOVENOX) injection 90 mg  90 mg Subcutaneous Daily Dunn, Dayna N, PA-C      . insulin aspart (novoLOG) injection 0-9 Units  0-9 Units Subcutaneous TID WC Dunn, Dayna N, PA-C   2 Units at 12/07/18 1159  . ketorolac (TORADOL) 30 MG/ML injection 30 mg  30 mg Intravenous Q6H Dunn, Dayna N, PA-C   30 mg at 12/07/18 0903  . MEDLINE mouth rinse  15 mL Mouth Rinse BID Swaziland, Peter M, MD      . morphine 2 MG/ML injection 2-4 mg  2-4 mg Intravenous Q2H PRN Ronie Spies N, PA-C   2 mg at 12/07/18 1042  . [START ON 12/08/2018] multivitamin with minerals tablet 1 tablet  1 tablet Oral Daily Dunn, Dayna N, PA-C      . nitroGLYCERIN (NITROSTAT) SL tablet 0.4 mg  0.4 mg Sublingual Q5 Min x 3 PRN Dunn, Dayna N, PA-C      . ondansetron (ZOFRAN) injection 4 mg  4 mg Intravenous Q6H PRN Dunn, Dayna N, PA-C      . pantoprazole (PROTONIX) EC tablet 40 mg  40 mg Oral Daily Dunn, Dayna N, PA-C   40 mg at 12/07/18 1035  . rosuvastatin (CRESTOR) tablet 10 mg  10 mg Oral QPM Dunn, Dayna N, PA-C      . sodium chloride flush (NS) 0.9 % injection 3 mL  3 mL Intravenous Q12H Dunn, Dayna N, PA-C   3 mL at 12/07/18 1035  . sodium chloride flush (NS) 0.9 % injection 3 mL  3 mL Intravenous PRN Dunn, Dayna N, PA-C        Medications Prior to Admission  Medication Sig Dispense Refill Last Dose  . ALPRAZolam (XANAX) 0.5 MG tablet Take 0.5 mg by mouth at bedtime as needed  for sleep.  0 unk at prn  . carisoprodol (SOMA) 350 MG tablet Take 350 mg by mouth  2 (two) times daily as needed for muscle spasms.   unk at prn  . carvedilol (COREG) 25 MG tablet Take 25 mg by mouth 2 (two) times daily.  4 12/06/2018 at Unknown time  . HUMULIN N 100 UNIT/ML injection Inject 40 Units into the skin 2 (two) times daily with a meal.  11 12/06/2018 at Unknown time  . HUMULIN R 100 UNIT/ML injection Inject 70 Units into the skin 3 (three) times daily with meals.  11 12/06/2018 at Unknown time  . hydrochlorothiazide (HYDRODIURIL) 25 MG tablet Take 25 mg by mouth daily.   12/06/2018 at Unknown time  . losartan (COZAAR) 25 MG tablet Take 25 mg by mouth daily.  6 12/06/2018 at Unknown time  . Multiple Vitamins-Minerals (MULTIVITAMIN WITH MINERALS) tablet Take 1 tablet by mouth daily.   12/06/2018 at Unknown time  . omeprazole (PRILOSEC) 20 MG capsule Take 20 mg by mouth every other day.   12/06/2018 at Unknown time  . pioglitazone (ACTOS) 30 MG tablet Take 30 mg by mouth daily.  11 12/06/2018 at Unknown time  . rosuvastatin (CRESTOR) 10 MG tablet Take 10 mg by mouth every evening.  6 12/06/2018 at Unknown time  . testosterone cypionate (DEPOTESTOSTERONE CYPIONATE) 200 MG/ML injection Inject 1 mL into the muscle every 14 (fourteen) days.  2 12/06/2018 at Unknown time  . VENTOLIN HFA 108 (90 Base) MCG/ACT inhaler Inhale 2 puffs into the lungs every 6 (six) hours as needed for wheezing.  0 unk at prn   Family History  Problem Relation Age of Onset  . Aortic dissection Father    Review of Systems:     Cardiac Review of Systems: Y or  [ N   ]= no  Chest Pain [  Y  ]  Orthopnea [ N ]   Pedal Edema [  N ]    Palpitations Klaus.Mock[N  ] Syncope  [ N ]   Presyncope [ N  ]  General Review of Systems: [Y] = yes [ N ]=no Constitional:  nausea [ N ]; night sweats [ N ]; fever [ N ]; or chills [ N ]                                                                Eye :   Amaurosis fugax[ N ]; Resp: cough [  N ];  wheezing[N  ];  hemoptysis[N  ]; shortness of breath[ Y ];  GI:   vomiting[N  ];  melena[ N ];  hematochezia Klaus.Mock[N  ];  GU:  hematuria[ N ];              Skin: venous stasis changes [ Y ]; Neuro: TIA[ N ];  stroke[ N ];    Psych:anxiety[Y  ];  Endocrine: diabetes[Y  ];                Physical Exam: BP 136/67   Pulse (!) 110   Temp 98.1 F (36.7 C) (Oral)   Resp (!) 24   Ht 5\' 9"  (1.753 m)   Wt (!) 183.9 kg   SpO2 95%   BMI 59.87 kg/m    General appearance: alert, cooperative and morbidly obese Head: Normocephalic, without obvious abnormality, atraumatic Neck: supple, symmetrical, trachea midline Resp: clear  to auscultation bilaterally Cardio: Tachycardic, no murmur GI: Soft, obese, non tender, bowel sounds present Extremities: venous stasis dermatitis noted Neurologic: Grossly normal  Diagnostic Studies & Laboratory data:     Recent Radiology Findings:   Ct Angio Chest/abd/pel For Dissection W And/or W/wo  Result Date: 12/07/2018 CLINICAL DATA:  Chest and abdominal pain EXAM: CT ANGIOGRAPHY CHEST, ABDOMEN AND PELVIS TECHNIQUE: Initially, axial CT images were obtained through the chest without intravenous contrast material administration. Multidetector CT imaging through the chest, abdomen and pelvis was performed using the standard protocol during bolus administration of intravenous contrast. Multiplanar reconstructed images and MIPs were obtained and reviewed to evaluate the vascular anatomy. CONTRAST:  100 mL ISOVUE-370 IOPAMIDOL (ISOVUE-370) INJECTION 76% COMPARISON:  None. FINDINGS: CTA CHEST FINDINGS Cardiovascular: No intramural hematoma is seen in the thoracic aorta on noncontrast enhanced study. There is no apparent thoracic aortic aneurysm or dissection. The visualized great vessels appear unremarkable. There is no demonstrable pulmonary embolus. Timing of the contrast bolus is less than optimal for visualization of more peripheral pulmonary arterial vessels. There  are foci of coronary artery calcification. There is a moderate pericardial effusion. The attenuation of the fluid in the pericardium is higher than is expected with serous fluid. Mediastinum/Nodes: Thyroid appears unremarkable. There is no appreciable thoracic adenopathy. No esophageal lesions are evident. Lungs/Pleura: There is atelectatic change in both lower lobe regions. There is no frank edema or consolidation. There is no appreciable pleural effusion or pleural thickening. There are a few small scattered granulomas in the lung bases. Musculoskeletal: There are no blastic or lytic bone lesions. There are foci of degenerative change in the thoracic spine. There are no chest wall lesions evident. Review of the MIP images confirms the above findings. CTA ABDOMEN AND PELVIS FINDINGS VASCULAR Aorta: There is no demonstrable abdominal aortic aneurysm or dissection. There are foci of aortic atherosclerosis. There is no hemodynamically significant obstruction in the abdominal aorta. Celiac: There is no aneurysm or dissection involving the celiac artery or its major branches. These vessels appear patent without appreciable obstructive disease. SMA: Superior mesenteric artery and its major branches appear patent. No aneurysm or dissection involving these vessels evident. Renals: There is a single renal artery on each side. Renal arteries and their branches show no aneurysm or dissection. No evident fibromuscular dysplasia. No appreciable vessel narrowing or obstruction. IMA: Inferior mesenteric artery and its branches appear patent without appreciable obstructive disease. No aneurysm or dissection involving these vessels. Inflow: There is calcification in portions of both common iliac arteries without hemodynamically significant obstruction. There is approximately 50% diameter stenosis in each proximal internal iliac artery with calcification in these vessels. There is patchy calcification in both external iliac arteries  without hemodynamically significant obstruction. There is mild atherosclerotic calcification in each common femoral artery without hemodynamically significant obstruction. Similar changes are noted in both proximal superficial femoral and profunda femoral arteries. No aneurysm or dissection involving these vessels evident. Veins: No obvious venous abnormality within the limitations of this arterial phase study. Review of the MIP images confirms the above findings. NON-VASCULAR Hepatobiliary: No focal liver lesions are appreciable. Gallbladder wall is not appreciably thickened. There is no biliary duct dilatation. Pancreas: No pancreatic mass or inflammatory focus. Spleen: No splenic lesions are evident. Adrenals/Urinary Tract: Adrenals bilaterally appear normal. There is a cyst arising from the medial upper pole left kidney measuring 2.0 x 2.0 cm. No hydronephrosis is evident on either side. There is contrast in the collecting systems bilaterally which potentially could mask  small calculi; no obvious calculi are appreciable in the kidneys. There is no evident ureteral calculus on either side. Urinary bladder is midline with wall thickness within normal limits. Stomach/Bowel: There is no appreciable bowel wall or mesenteric thickening. No evident bowel obstruction. No free air or portal venous air. Lymphatic: No adenopathy is evident in the abdomen or pelvis. Reproductive: Prostate and seminal vesicles appear normal in size and contour. No pelvic mass evident. Other: There is a midline ventral hernia which contains fat but no bowel. The appendix region appears unremarkable without inflammation. There is no abscess or ascites in the abdomen or pelvis. Musculoskeletal: No evident blastic or lytic bone lesions. There is calcification along the periphery of the left gluteus maximus muscle, potentially residua of old trauma. Intramuscular regions otherwise appear normal. Review of the MIP images confirms the above  findings. IMPRESSION: CT angiogram chest: 1. There is a moderate pericardial effusion. The attenuation of the fluid in the pericardium is higher than is expected with serous fluid. Exudative fluid secondary to infection or possible neoplasm is a differential consideration. Hemorrhage within the pericardium is a differential consideration as well. No contrast extravasation is seen extending into the pericardium. This finding may warrant echocardiography to further assess. 2. No thoracic aortic aneurysm or dissection evident. No pulmonary embolus seen with proviso that the timing bolus of the contrast is less than optimal to visualized more peripheral pulmonary arterial vessels. 3. Bibasilar atelectasis. No frank edema or consolidation. Occasional small calcified granulomas in the bases. 4.  There are foci of coronary artery calcification. 5.  No evident thoracic adenopathy. CT angiogram abdomen; CT angiogram pelvis: 1. No aneurysm or dissection involving the aorta or major mesenteric/pelvic arterial vessels. There are foci of atherosclerosis in the aorta as well as in multiple pelvic arterial vessels without hemodynamically significant obstruction in these areas. 2.  Ventral hernia containing fat but no bowel. 3. No hydronephrosis. No ureteral calculi. No renal calculi evident with the proviso that contrast in the collecting systems could obscure calculi. 4. No evident bowel obstruction. No abscess in the abdomen or pelvis. Appendix region appears normal. Aortic Atherosclerosis (ICD10-I70.0). These results were called by telephone at the time of interpretation on 12/07/2018 at 8:49 am to [facility], who verbally acknowledged these results. Electronically Signed   By: Bretta Bang III M.D.   On: 12/07/2018 08:50     I have independently reviewed the above radiologic studies and discussed with the patient   Recent Lab Findings: Lab Results  Component Value Date   WBC 17.2 (H) 12/07/2018   HGB 16.8  12/07/2018   HCT 53.5 (H) 12/07/2018   PLT 237 12/07/2018   GLUCOSE 104 (H) 12/07/2018   CHOL 145 12/07/2018   TRIG 120 12/07/2018   HDL 42 12/07/2018   LDLCALC 79 12/07/2018   ALT 26 12/07/2018   AST 25 12/07/2018   NA 140 12/07/2018   K 3.7 12/07/2018   CL 96 (L) 12/07/2018   CREATININE 0.82 12/07/2018   BUN 16 12/07/2018   CO2 32 12/07/2018   TSH 2.280 12/07/2018   INR SPECIMEN CLOTTED 12/07/2018   HGBA1C 7.2 (H) 12/07/2018   Assessment / Plan:   1. Pericardial effusion-needs urgent subxiphoid pericardial window. This will be done by Dr. Cornelius Moras later this afternoon 2. Diabetes mellitus without complication (HCC)-was on Actos 30 mg daily and Insulin prior to admission 3. Hyperlipidemia-on Crestor 5 mg every evening 4. Hypertension-on Coreg 6.25 mg bid. On Losartan 25 mg daily and  HCTZ 25 mg daily prior to surgery. 5. Morbid obesity 6. OSA (obstructive sleep apnea)-patient states he does not wear CPAP nightly as has problems with machine.   I  spent 15 minutes counseling the patient face to face.   Doree Fudge PA-C 12/07/2018 12:52 PM   I have seen and examined the patient and agree with the assessment as outlined above by Doree Fudge, PA-C.  Patient is a morbidly obese 52 year old male with history of hypertension, type 2 diabetes mellitus, hyperlipidemia, and obstructive sleep apnea who presents with relatively acute onset of atypical chest pain and acute exacerbation of chronic shortness of breath.  EKG revealed diffuse ST segment elevation prompting activation of code STEMI.  Diagnostic cardiac catheterization revealed mild nonobstructive coronary artery disease.  CT angiogram of the chest was performed to rule out aortic dissection, but the patient was found to have a moderate size circumferential pericardial effusion.  Echocardiogram confirmed the presence of at least a moderate sized pericardial effusion with early signs of tamponade.  I have personally  reviewed the patient's diagnostic cardiac catheterization, CT angiogram, and echocardiogram.  Echocardiogram reveals a moderate to large sized free-flowing pericardial effusion.  Although there are no obvious signs of tamponade there may be early right ventricular diastolic compression.  There are no other complicating features.  CT angiogram of the chest confirmed the presence of the pericardial effusion.  Imaging of the aorta is suboptimal because of the patient's morbid obesity.  There appears to be flow artifact in the aorta.  I cannot be 100% certain that there is not an aortic dissection present, although I suspect the abnormalities on CT imaging are related to motion artifact.  I have discussed the indications, risk, and potential benefits of subxiphoid pericardial window with the patient and his wife at the bedside.  We plan to proceed directly to the operating room where initially he will undergo transesophageal echocardiogram after induction with general and anesthesia.  Presuming that TEE confirms a normal-appearing thoracic aorta we will proceed with subxiphoid pericardial window.  The patient and his wife understand and accept all associated risks of surgery and desired to proceed as described.  All the questions have been addressed.   I spent in excess of 30 minutes during the conduct of this hospital encounter and >50% of this time involved direct face-to-face encounter with the patient for counseling and/or coordination of their care.    Purcell Nails, MD 12/07/2018 3:16 PM

## 2018-12-07 NOTE — Progress Notes (Signed)
  Echocardiogram Echocardiogram Transesophageal has been performed.  Janalyn HarderWest, Anuja Manka R 12/07/2018, 3:36 PM

## 2018-12-07 NOTE — ED Notes (Signed)
Pt O2 sats dropped after receiving Fentanyl, pt placed on 4L oxygen via nasal canula.

## 2018-12-07 NOTE — Op Note (Signed)
CARDIOTHORACIC SURGERY OPERATIVE NOTE  Date of Procedure:   12/07/2018  Preoperative Diagnosis:  Pericardial Effusion with Early Pericardial Tamponade  Postoperative Diagnosis:  same  Procedure:    Subxyphoid Pericardial Window  Surgeon:    Salvatore Decentlarence H. Cornelius Moraswen, MD  Assistant:    Jari Favreessa Conte, PA-C  Anesthesia:    Achille RichAdam Hodierne, MD  Operative Findings:   Bloody pericardial effusion  Extreme morbid obesity     BRIEF CLINICAL NOTE AND INDICATIONS FOR SURGERY  Patient is a morbidly obese 52 year old male with history of hypertension, type 2 diabetes mellitus, hyperlipidemia, and obstructive sleep apnea who presents with relatively acute onset of atypical chest pain and acute exacerbation of chronic shortness of breath.  EKG revealed diffuse ST segment elevation prompting activation of code STEMI.  Diagnostic cardiac catheterization revealed mild nonobstructive coronary artery disease.  CT angiogram of the chest was performed to rule out aortic dissection, but the patient was found to have a moderate size circumferential pericardial effusion.  Echocardiogram confirmed the presence of at least a moderate sized pericardial effusion with early signs of tamponade.  The patient has been seen in consultation and counseled at length regarding the indications, risks and potential benefits of surgery.  All questions have been answered, and the patient provides full informed consent for the operation as described.      DETAILS OF THE OPERATIVE PROCEDURE  The patient is brought to the operating room on the above mentioned date and placed in the supine position on the operating table. A left radial arterial line and central venous catheter placed by the anesthesia team. Intravenous antibiotics are administered. Pneumatic sequential compression boots are placed on both lower extremities. General endotracheal anesthesia is induced uneventfully. A Foley catheter is placed.  Baseline transesophageal  echocardiogram was performed by Dr. Chaney MallingHodierne.  TEE confirmed a normal-appearing thoracic aorta with no sign of aortic dissection.  There is a moderate to large free-flowing pericardial effusion.  The patient's anterior chest abdomen and both groins are prepared and draped in a sterile manner.  A time out procedure is performed. A vertical incision is made in the midline beginning at the xiphoid process and extending towards the umbilicus. The incision is completed through the subcutaneous tissue and linea alba using electrocautery. The left body of rectus abdominis muscle is retracted in a cephalad direction and the subjacent pre-peritoneal fat and fascia retracted inferiorly until the anterior surface of the pericardium was exposed.  Exposure is extremely challenging due to the patient's extreme morbid obesity with more than 8 cm of fat between the skin and the anterior surface of the rectus abdominis fascia and another 8 cm of fat in the preperitoneal space.  Ultimately the inferior margin of the pericardium is identified.  A small incision is made in the pericardium. A large amount of bloody fluid is immediately evacuated. Portions of the fluid are trapped to be sent for routine laboratory data, culture, and cytology. A total of 450 mL of fluid is evacuated. Transesophageal echocardiogram was performed confirming evacuation of the majority of the pericardial effusion. The pericardial space is drained with a single 32 French Bard chest tube placed through a separate stab incision. The abdominal fascia is closed in the midline using 2 layers of interrupted #1 Vicryl suture. Subcutaneous tissues are closed in layers and the skin is closed with a running subcuticular skin closure.  A Preveena wound VAC is applied.  The patient tolerated the procedure well, was extubated in the operating room, and transported to  the postanesthesia care unit in stable condition. Estimated blood loss was trivial. There are no  intraoperative complications. All sponge instrument and needle counts are verified correct at completion the operation.     Salvatore Decent. Cornelius Moras MD 12/07/2018 5:44 PM

## 2018-12-07 NOTE — Transfer of Care (Signed)
Immediate Anesthesia Transfer of Care Note  Patient: Cody CouncilRichard Parisi  Procedure(s) Performed: SUBXYPHOID PERICARDIAL WINDOW (N/A Chest) TRANSESOPHAGEAL ECHOCARDIOGRAM (TEE) (N/A )  Patient Location: PACU  Anesthesia Type:General  Level of Consciousness: awake, drowsy and patient cooperative  Airway & Oxygen Therapy: Patient Spontanous Breathing and Patient connected to face mask oxygen  Post-op Assessment: Report given to RN and Post -op Vital signs reviewed and stable  Post vital signs: Reviewed and stable  Last Vitals:  Vitals Value Taken Time  BP 124/76 12/07/2018  6:24 PM  Temp    Pulse 107 12/07/2018  6:25 PM  Resp 19 12/07/2018  6:25 PM  SpO2 91 % 12/07/2018  6:25 PM  Vitals shown include unvalidated device data.  Last Pain:  Vitals:   12/07/18 1815  TempSrc:   PainSc: (P) Asleep         Complications: No apparent anesthesia complications

## 2018-12-07 NOTE — Anesthesia Procedure Notes (Signed)
Procedure Name: Intubation Date/Time: 12/07/2018 3:52 PM Performed by: Tillman AbideHawkins, Khristian Seals B, CRNA Pre-anesthesia Checklist: Patient identified, Emergency Drugs available, Suction available and Patient being monitored Patient Re-evaluated:Patient Re-evaluated prior to induction Oxygen Delivery Method: Circle System Utilized Preoxygenation: Pre-oxygenation with 100% oxygen Induction Type: IV induction Ventilation: Mask ventilation without difficulty and Two handed mask ventilation required Laryngoscope Size: Glidescope and 4 Grade View: Grade I Tube type: Oral Number of attempts: 1 Airway Equipment and Method: Stylet and Oral airway Placement Confirmation: ETT inserted through vocal cords under direct vision,  positive ETCO2 and breath sounds checked- equal and bilateral Secured at: 23 cm Tube secured with: Tape Dental Injury: Teeth and Oropharynx as per pre-operative assessment

## 2018-12-07 NOTE — ED Notes (Signed)
Called Code [id number] 

## 2018-12-07 NOTE — ED Triage Notes (Signed)
Pt reports having chest pain that started in center of chest and radiating to back and neck. Pt reports pain started at 0000.

## 2018-12-07 NOTE — Progress Notes (Signed)
Received call from nurse re: ongoing chest pain. Dr. [name] preferred to await result of CT prior to treating. CT exam had ended so I called radiology ASAP to review acute result. No acute dissection evident, no large PE; did show moderate pericardial effusion with higher attenuation suggestive of exudative fluid collection secondary to infection, neoplasm or hemorrhage in the differential. No contrast extravasation was seen extending into the pericardium. I called echo department for stat echo who indicated their team would be right up to review. I reviewed result with Dr. Clifton JamesMcAlhany as our team is assuming care. Plan Toradol and Morphine for pain at present time and await stat echo result to determine next steps. Baptiste Littler PA-C

## 2018-12-07 NOTE — Plan of Care (Signed)
Patient alert, drowsy, follows commands.  Wife at bedside.  Encouraged to take deep breaths, poor effort using ISP but able to achieve 750 with much encouragement. Monitoring as per protocol.

## 2018-12-07 NOTE — Anesthesia Procedure Notes (Signed)
Central Venous Catheter Insertion Performed by: Achille RichHodierne, Ashtan Girtman, MD, anesthesiologist Start/End12/10/2018 4:10 PM, 12/07/2018 4:20 PM Patient location: OR. Preanesthetic checklist: patient identified, IV checked, site marked, risks and benefits discussed, surgical consent, monitors and equipment checked, pre-op evaluation, timeout performed and anesthesia consent Position: Trendelenburg Patient sedated Hand hygiene performed , maximum sterile barriers used  and Seldinger technique used Catheter size: 8.5 Fr Central line was placed.Sheath introducer Procedure performed using ultrasound guided technique. Ultrasound Notes:anatomy identified, needle tip was noted to be adjacent to the nerve/plexus identified, no ultrasound evidence of intravascular and/or intraneural injection and image(s) printed for medical record Attempts: 1 Following insertion, line sutured, dressing applied and Biopatch. Post procedure assessment: blood return through all ports, free fluid flow and no air  Patient tolerated the procedure well with no immediate complications.

## 2018-12-07 NOTE — Progress Notes (Signed)
  Echocardiogram 2D Echocardiogram has been performed.  Cody Erickson, Cody Erickson 12/07/2018, 9:42 AM

## 2018-12-07 NOTE — Brief Op Note (Signed)
12/07/2018  5:48 PM  PATIENT:  Zara Councilichard Milnes  52 y.o. male  PRE-OPERATIVE DIAGNOSIS:  PERICARDIAL EFFUSION  POST-OPERATIVE DIAGNOSIS:  PERICARDIAL EFFUSION  PROCEDURE:  Procedure(s): SUBXYPHOID PERICARDIAL WINDOW (N/A) TRANSESOPHAGEAL ECHOCARDIOGRAM (TEE) (N/A)  SURGEON:  Surgeon(s) and Role:    * Purcell Nailswen, Clarence H, MD - Primary  PHYSICIAN ASSISTANT:  Jari Favreessa Alexus Michael, PA-C   ANESTHESIA:   general  EBL:  200 mL   BLOOD ADMINISTERED:none  DRAINS: ONE BLAKE CHEST TUBE   LOCAL MEDICATIONS USED:  NONE  SPECIMEN:  No Specimen  DISPOSITION OF SPECIMEN:  N/A  COUNTS:  YES  DICTATION: .Dragon Dictation  PLAN OF CARE: Admit to inpatient   PATIENT DISPOSITION:  PACU - hemodynamically stable.   Delay start of Pharmacological VTE agent (>24hrs) due to surgical blood loss or risk of bleeding: yes

## 2018-12-07 NOTE — ED Provider Notes (Addendum)
TIME SEEN: 5:30 AM  CHIEF COMPLAINT: chest pain  HPI: Patient is a 52 year old male with history of morbid obesity, hypertension, diabetes, hyperlipidemia, tobacco use who presents to the emergency department with anterior chest pain.  States it started around midnight while he was sleeping and woke him from sleep.  He has felt short of breath, nauseous and dizzy but no diaphoresis.  States that he was worried he is having a heart attack.  Reports symptoms worse with any mild exertion.  He describes the pain as a 10 out of 10 and radiates into his back.  No history of previous cardiac catheterization.  No history of MI.  He does not have a cardiologist.  Reports previous stress test years ago.  ROS: See HPI Constitutional: no fever  Eyes: no drainage  ENT: no runny nose   Cardiovascular:  chest pain  Resp:  SOB  GI: no vomiting GU: no dysuria Integumentary: no rash  Allergy: no hives  Musculoskeletal: no leg swelling  Neurological: no slurred speech ROS otherwise negative  PAST MEDICAL HISTORY/PAST SURGICAL HISTORY:  Past Medical History:  Diagnosis Date  . Diabetes mellitus without complication (HCC)   . Hypertension     MEDICATIONS:  Prior to Admission medications   Not on File    ALLERGIES:  Allergies  Allergen Reactions  . Amoxicillin Hives    SOCIAL HISTORY:  Social History   Tobacco Use  . Smoking status: Never Smoker  . Smokeless tobacco: Never Used  Substance Use Topics  . Alcohol use: Never    Frequency: Never    FAMILY HISTORY: History reviewed. No pertinent family history.  EXAM: BP 121/89 (BP Location: Left Arm)   Pulse (!) 105   Temp 97.8 F (36.6 C) (Oral)   Resp 19   Ht 5\' 9"  (1.753 m)   Wt (!) 181.4 kg   SpO2 96%   BMI 59.07 kg/m  CONSTITUTIONAL: Alert and oriented and responds appropriately to questions.  Morbidly obese, appears uncomfortable HEAD: Normocephalic EYES: Conjunctivae clear, pupils appear equal, EOMI ENT: normal nose;  moist mucous membranes NECK: Supple, no meningismus, no nuchal rigidity, no LAD  CARD: Regular and tachycardic; S1 and S2 appreciated; no murmurs, no clicks, no rubs, no gallops RESP: Normal chest excursion without splinting or tachypnea; breath sounds clear and equal bilaterally; no wheezes, no rhonchi, no rales, no hypoxia or respiratory distress, speaking full sentences ABD/GI: Normal bowel sounds; non-distended; soft, non-tender, no rebound, no guarding, no peritoneal signs, no hepatosplenomegaly BACK:  The back appears normal and is non-tender to palpation, there is no CVA tenderness EXT: Normal ROM in all joints; non-tender to palpation; no edema; normal capillary refill; no cyanosis, no calf tenderness or swelling    SKIN: Normal color for age and race; warm; no rash NEURO: Moves all extremities equally PSYCH: The patient's mood and manner are appropriate. Grooming and personal hygiene are appropriate.  MEDICAL DECISION MAKING: Patient here with chest pain.  EKG is concerning for an acute MI.  He has ST elevation in inferior leads.  ST elevation is only 1 mm and no reciprocal changes noted but patient has multiple risk factors and concerning story for ACS.  I have discussed patient with Dr. [name] on-call for cardiology.  He agrees that we should activate a code STEMI and [facility].  Patient has no recent bleeding or surgery.  No recent injuries.  Will start heparin.  Will give aspirin.  Given this is an inferior MI,  will avoid nitroglycerin.  Will give fentanyl and Zofran for symptomatic relief.  Will obtain labs.  [facility] has been made aware that patient will need to be transferred emergently.  Discussed with Dr. Wilkie Aye in the ED who agrees to accept patient in transfer.  Patient and wife at bedside have been updated with plan and will be transferred emergently to [facility].  Code STEMI activated.  Oxygen saturation was 96% initially.  After  receiving fentanyl he dropped to 84% but this improved after being placed on oxygen via nasal cannula.   EKG Interpretation  Date/Time:  Wednesday December 07 2018 05:20:41 EST Ventricular Rate:  105 PR Interval:    QRS Duration: 102 QT Interval:  326 QTC Calculation: 431 R Axis:   49 Text Interpretation:  Sinus tachycardia Low voltage, precordial leads Inferior MI (acute or recent) Confirmed by Alok Minshall, Baxter Hire [phone]) on 12/07/2018 5:30:39 AM        CRITICAL CARE Performed by: Baxter Hire Abagael Kramm   Total critical care time: 35 minutes  Critical care time was exclusive of separately billable procedures and treating other patients.  Critical care was necessary to treat or prevent imminent or life-threatening deterioration.  Critical care was time spent personally by me on the following activities: development of treatment plan with patient and/or surrogate as well as nursing, discussions with consultants, evaluation of patient's response to treatment, examination of patient, obtaining history from patient or surrogate, ordering and performing treatments and interventions, ordering and review of laboratory studies, ordering and review of radiographic studies, pulse oximetry and re-evaluation of patient's condition.     Khalif Stender, Layla Maw, DO 12/07/18 [phone]

## 2018-12-07 NOTE — Progress Notes (Signed)
Placed patient on BIPAP due to arterial blood gas results.  

## 2018-12-08 ENCOUNTER — Encounter (HOSPITAL_COMMUNITY): Payer: Self-pay | Admitting: Thoracic Surgery (Cardiothoracic Vascular Surgery)

## 2018-12-08 DIAGNOSIS — I313 Pericardial effusion (noninflammatory): Principal | ICD-10-CM

## 2018-12-08 LAB — CBC WITH DIFFERENTIAL/PLATELET
Abs Immature Granulocytes: 0.12 10*3/uL — ABNORMAL HIGH (ref 0.00–0.07)
Basophils Absolute: 0 10*3/uL (ref 0.0–0.1)
Basophils Relative: 0 %
Eosinophils Absolute: 0 10*3/uL (ref 0.0–0.5)
Eosinophils Relative: 0 %
HCT: 48.2 % (ref 39.0–52.0)
Hemoglobin: 14.8 g/dL (ref 13.0–17.0)
Immature Granulocytes: 1 %
Lymphocytes Relative: 3 %
Lymphs Abs: 0.7 10*3/uL (ref 0.7–4.0)
MCH: 27 pg (ref 26.0–34.0)
MCHC: 30.7 g/dL (ref 30.0–36.0)
MCV: 88 fL (ref 80.0–100.0)
MONO ABS: 1.7 10*3/uL — AB (ref 0.1–1.0)
Monocytes Relative: 9 %
Neutro Abs: 16.3 10*3/uL — ABNORMAL HIGH (ref 1.7–7.7)
Neutrophils Relative %: 87 %
Platelets: 216 10*3/uL (ref 150–400)
RBC: 5.48 MIL/uL (ref 4.22–5.81)
RDW: 14.7 % (ref 11.5–15.5)
WBC: 18.9 10*3/uL — ABNORMAL HIGH (ref 4.0–10.5)
nRBC: 0 % (ref 0.0–0.2)

## 2018-12-08 LAB — GLUCOSE, CAPILLARY
GLUCOSE-CAPILLARY: 201 mg/dL — AB (ref 70–99)
Glucose-Capillary: 179 mg/dL — ABNORMAL HIGH (ref 70–99)
Glucose-Capillary: 215 mg/dL — ABNORMAL HIGH (ref 70–99)
Glucose-Capillary: 225 mg/dL — ABNORMAL HIGH (ref 70–99)
Glucose-Capillary: 235 mg/dL — ABNORMAL HIGH (ref 70–99)
Glucose-Capillary: 257 mg/dL — ABNORMAL HIGH (ref 70–99)
Glucose-Capillary: 274 mg/dL — ABNORMAL HIGH (ref 70–99)

## 2018-12-08 LAB — BASIC METABOLIC PANEL
Anion gap: 13 (ref 5–15)
BUN: 19 mg/dL (ref 6–20)
CO2: 28 mmol/L (ref 22–32)
Calcium: 8.7 mg/dL — ABNORMAL LOW (ref 8.9–10.3)
Chloride: 95 mmol/L — ABNORMAL LOW (ref 98–111)
Creatinine, Ser: 1.04 mg/dL (ref 0.61–1.24)
GFR calc Af Amer: >60 mL/min (ref >60)
GFR calc non Af Amer: >60 mL/min (ref >60)
Glucose, Bld: 252 mg/dL — ABNORMAL HIGH (ref 70–99)
POTASSIUM: 4.3 mmol/L (ref 3.5–5.1)
Sodium: 136 mmol/L (ref 135–145)

## 2018-12-08 LAB — POCT I-STAT 3, ART BLOOD GAS (G3+)
Acid-Base Excess: 2 mmol/L (ref 0.0–2.0)
Bicarbonate: 30.4 mmol/L — ABNORMAL HIGH (ref 20.0–28.0)
O2 Saturation: 96 %
Patient temperature: 98.6
TCO2: 32 mmol/L (ref 22–32)
pCO2 arterial: 59.2 mmHg — ABNORMAL HIGH (ref 32.0–48.0)
pH, Arterial: 7.319 — ABNORMAL LOW (ref 7.350–7.450)
pO2, Arterial: 93 mmHg (ref 83.0–108.0)

## 2018-12-08 LAB — ACID FAST SMEAR (AFB, MYCOBACTERIA): Acid Fast Smear: NEGATIVE

## 2018-12-08 LAB — PROTEIN, BODY FLUID (OTHER): Total Protein, Body Fluid Other: 7 g/dL

## 2018-12-08 MED ORDER — ALBUTEROL SULFATE (2.5 MG/3ML) 0.083% IN NEBU
2.5000 mg | INHALATION_SOLUTION | RESPIRATORY_TRACT | Status: DC
  Administered 2018-12-08 – 2018-12-10 (×10): 2.5 mg via RESPIRATORY_TRACT
  Filled 2018-12-08 (×9): qty 3

## 2018-12-08 MED ORDER — INSULIN ASPART 100 UNIT/ML ~~LOC~~ SOLN
6.0000 [IU] | Freq: Three times a day (TID) | SUBCUTANEOUS | Status: DC
  Administered 2018-12-08 – 2018-12-10 (×5): 6 [IU] via SUBCUTANEOUS

## 2018-12-08 MED ORDER — IBUPROFEN 200 MG PO TABS
800.0000 mg | ORAL_TABLET | Freq: Three times a day (TID) | ORAL | Status: DC
  Administered 2018-12-08 – 2018-12-10 (×7): 800 mg via ORAL
  Filled 2018-12-08 (×7): qty 4

## 2018-12-08 MED ORDER — INSULIN DETEMIR 100 UNIT/ML ~~LOC~~ SOLN
20.0000 [IU] | Freq: Two times a day (BID) | SUBCUTANEOUS | Status: DC
  Administered 2018-12-08 – 2018-12-09 (×4): 20 [IU] via SUBCUTANEOUS
  Filled 2018-12-08 (×6): qty 0.2

## 2018-12-08 MED FILL — Nitroglycerin IV Soln 100 MCG/ML in D5W: INTRA_ARTERIAL | Qty: 10 | Status: AC

## 2018-12-08 NOTE — Progress Notes (Signed)
Placed patient on BIPAP for the night.  

## 2018-12-08 NOTE — Progress Notes (Signed)
Inpatient Diabetes Program Recommendations  AACE/ADA: New Consensus Statement on Inpatient Glycemic Control (2015)  Target Ranges:  Prepandial:   less than 140 mg/dL      Peak postprandial:   less than 180 mg/dL (1-2 hours)      Critically ill patients:  140 - 180 mg/dL   Lab Results  Component Value Date   GLUCAP 235 (H) 12/08/2018   HGBA1C 7.2 (H) 12/07/2018    Review of Glycemic Control Results for [name], Cody (MRN [id number]) as of 12/08/2018 11:58  Ref. Range 12/07/2018 14:57 12/07/2018 21:45 12/07/2018 23:18 12/08/2018 03:40 12/08/2018 08:18  Glucose-Capillary Latest Ref Range: 70 - 99 mg/dL 782215 (H) 956302 (H) 213315 (H) 257 (H) 235 (H)   Diabetes history: Type 2 DM Outpatient Diabetes medications:  NPH 40 unit bid, Humulin R 70 units tid with meals, Actos 30 mg daily Current orders for Inpatient glycemic control:  Novolog 0-24 units tid with meals Inpatient Diabetes Program Recommendations:    Please consider restarting Levemir 20 units bid. Also consider adding Novolog meal coverage 6 units tid with meals (hold if patient eats less than 50%).   Thanks,  Beryl MeagerJenny Loi Rennaker, RN, BC-ADM Inpatient Diabetes Coordinator Pager [phone] (8a-5p)

## 2018-12-08 NOTE — Progress Notes (Signed)
CT surgery p.m. Rounds  Patient breathing comfortably in bed Minimal chest tube output from pericardial drain through the day

## 2018-12-08 NOTE — Plan of Care (Signed)
  Problem: Education: Goal: Knowledge of General Education information will improve Description: Including pain rating scale, medication(s)/side effects and non-pharmacologic comfort measures Outcome: Progressing   Problem: Clinical Measurements: Goal: Ability to maintain clinical measurements within normal limits will improve Outcome: Progressing Goal: Diagnostic test results will improve Outcome: Progressing   Problem: Activity: Goal: Risk for activity intolerance will decrease Outcome: Progressing   Problem: Nutrition: Goal: Adequate nutrition will be maintained Outcome: Progressing   

## 2018-12-08 NOTE — Progress Notes (Addendum)
[address]       [place] [address]             [phone]      1 Day Post-Op Procedure(s) (LRB): SUBXYPHOID PERICARDIAL WINDOW (N/A) TRANSESOPHAGEAL ECHOCARDIOGRAM (TEE) (N/A) Subjective: Feels ok, anxious to get BiPAP off as soon as possible  Objective: Vital signs in last 24 hours: Temp:  [98.2 F (36.8 C)-99.2 F (37.3 C)] 98.2 F (36.8 C) (12/12 0400) Pulse Rate:  [99-118] 104 (12/12 0753) Cardiac Rhythm: Sinus tachycardia (12/11 2000) Resp:  [12-26] 21 (12/12 0753) BP: (109-155)/(67-118) 124/76 (12/11 1815) SpO2:  [90 %-100 %] 97 % (12/12 0755) Arterial Line BP: (117-186)/(60-84) 157/76 (12/12 0700) FiO2 (%):  [40 %-50 %] 40 % (12/12 0755)  Hemodynamic parameters for last 24 hours:    Intake/Output from previous day: 12/11 0701 - 12/12 0700 In: 3159.6 [P.O.:180; I.V.:1872.1; IV Piggyback:1107.5] Out: 1875 [Urine:1475; Blood:200; Chest Tube:200] Intake/Output this shift: Total I/O In: -  Out: 240 [Urine:240]  General appearance: alert, cooperative and no distress Heart: regular rate and rhythm Lungs: clear anteriorly Abdomen: obese, nontender Extremities: + edema Wound: dressings CDI               (provena) Lab Results: Recent Labs    12/07/18 0526 12/07/18 0630 12/08/18 0630  WBC 17.2*  --  18.9*  HGB 16.8 17.3* 14.8  HCT 53.5* 51.0 48.2  PLT 237  --  216   BMET:  Recent Labs    12/07/18 0526 12/07/18 0630 12/08/18 0630  NA 140 138 136  K 3.7 3.8 4.3  CL 96* 96* 95*  CO2 32  --  28  GLUCOSE 104* 141* 252*  BUN 16 15 19   CREATININE 0.82 0.60* 1.04  CALCIUM 9.4  --  8.7*    PT/INR:  Recent Labs    12/07/18 0526  LABPROT SPECIMEN CLOTTED  INR SPECIMEN CLOTTED   ABG    Component Value Date/Time   PHART 7.319 (L) 12/08/2018 0331   HCO3 30.4 (H) 12/08/2018 0331   TCO2 32 12/08/2018 0331   O2SAT 96.0 12/08/2018 0331   CBG (last 3)  Recent Labs    12/07/18 2145 12/07/18 2318 12/08/18 0340  GLUCAP  302* 315* 257*    Meds Scheduled Meds: . acetaminophen  1,000 mg Oral Q6H   Or  . acetaminophen (TYLENOL) oral liquid 160 mg/5 mL  1,000 mg Oral Q6H  . albuterol  2.5 mg Nebulization Q4H while awake  . bisacodyl  10 mg Oral Daily  . carvedilol  6.25 mg Oral BID WC  . colchicine  0.6 mg Oral BID  . insulin aspart  0-24 Units Subcutaneous Q4H  . pantoprazole  40 mg Oral Daily  . senna-docusate  1 tablet Oral QHS   Continuous Infusions: . sodium chloride 50 mL/hr at 12/08/18 0600  . potassium chloride     PRN Meds:.carisoprodol, morphine injection, ondansetron (ZOFRAN) IV, oxyCODONE, potassium chloride, traMADol  Xrays Dg Chest Port 1 View  Result Date: 12/07/2018 CLINICAL DATA:  Postprocedure EXAM: PORTABLE CHEST 1 VIEW COMPARISON:  CT chest 12/07/2018 FINDINGS: Limited by body habitus. Right-sided central venous catheter tip poorly visible, likely over the SVC. No gross pneumothorax. Cardiomegaly without edema. IMPRESSION: 1. Limited by habitus. There appears to be right central venous catheter, the tip is poorly visible but suspected to overlie the SVC region. No gross pneumothorax 2. Cardiomegaly.  Low lung volume. Electronically Signed   By: Adrian Prows.D.  On: 12/07/2018 19:04   Ct Angio Chest/abd/pel For Dissection W And/or W/wo  Result Date: 12/07/2018 CLINICAL DATA:  Chest and abdominal pain EXAM: CT ANGIOGRAPHY CHEST, ABDOMEN AND PELVIS TECHNIQUE: Initially, axial CT images were obtained through the chest without intravenous contrast material administration. Multidetector CT imaging through the chest, abdomen and pelvis was performed using the standard protocol during bolus administration of intravenous contrast. Multiplanar reconstructed images and MIPs were obtained and reviewed to evaluate the vascular anatomy. CONTRAST:  100 mL ISOVUE-370 IOPAMIDOL (ISOVUE-370) INJECTION 76% COMPARISON:  None. FINDINGS: CTA CHEST FINDINGS Cardiovascular: No intramural hematoma is seen  in the thoracic aorta on noncontrast enhanced study. There is no apparent thoracic aortic aneurysm or dissection. The visualized great vessels appear unremarkable. There is no demonstrable pulmonary embolus. Timing of the contrast bolus is less than optimal for visualization of more peripheral pulmonary arterial vessels. There are foci of coronary artery calcification. There is a moderate pericardial effusion. The attenuation of the fluid in the pericardium is higher than is expected with serous fluid. Mediastinum/Nodes: Thyroid appears unremarkable. There is no appreciable thoracic adenopathy. No esophageal lesions are evident. Lungs/Pleura: There is atelectatic change in both lower lobe regions. There is no frank edema or consolidation. There is no appreciable pleural effusion or pleural thickening. There are a few small scattered granulomas in the lung bases. Musculoskeletal: There are no blastic or lytic bone lesions. There are foci of degenerative change in the thoracic spine. There are no chest wall lesions evident. Review of the MIP images confirms the above findings. CTA ABDOMEN AND PELVIS FINDINGS VASCULAR Aorta: There is no demonstrable abdominal aortic aneurysm or dissection. There are foci of aortic atherosclerosis. There is no hemodynamically significant obstruction in the abdominal aorta. Celiac: There is no aneurysm or dissection involving the celiac artery or its major branches. These vessels appear patent without appreciable obstructive disease. SMA: Superior mesenteric artery and its major branches appear patent. No aneurysm or dissection involving these vessels evident. Renals: There is a single renal artery on each side. Renal arteries and their branches show no aneurysm or dissection. No evident fibromuscular dysplasia. No appreciable vessel narrowing or obstruction. IMA: Inferior mesenteric artery and its branches appear patent without appreciable obstructive disease. No aneurysm or dissection  involving these vessels. Inflow: There is calcification in portions of both common iliac arteries without hemodynamically significant obstruction. There is approximately 50% diameter stenosis in each proximal internal iliac artery with calcification in these vessels. There is patchy calcification in both external iliac arteries without hemodynamically significant obstruction. There is mild atherosclerotic calcification in each common femoral artery without hemodynamically significant obstruction. Similar changes are noted in both proximal superficial femoral and profunda femoral arteries. No aneurysm or dissection involving these vessels evident. Veins: No obvious venous abnormality within the limitations of this arterial phase study. Review of the MIP images confirms the above findings. NON-VASCULAR Hepatobiliary: No focal liver lesions are appreciable. Gallbladder wall is not appreciably thickened. There is no biliary duct dilatation. Pancreas: No pancreatic mass or inflammatory focus. Spleen: No splenic lesions are evident. Adrenals/Urinary Tract: Adrenals bilaterally appear normal. There is a cyst arising from the medial upper pole left kidney measuring 2.0 x 2.0 cm. No hydronephrosis is evident on either side. There is contrast in the collecting systems bilaterally which potentially could mask small calculi; no obvious calculi are appreciable in the kidneys. There is no evident ureteral calculus on either side. Urinary bladder is midline with wall thickness within normal limits. Stomach/Bowel: There is no  appreciable bowel wall or mesenteric thickening. No evident bowel obstruction. No free air or portal venous air. Lymphatic: No adenopathy is evident in the abdomen or pelvis. Reproductive: Prostate and seminal vesicles appear normal in size and contour. No pelvic mass evident. Other: There is a midline ventral hernia which contains fat but no bowel. The appendix region appears unremarkable without  inflammation. There is no abscess or ascites in the abdomen or pelvis. Musculoskeletal: No evident blastic or lytic bone lesions. There is calcification along the periphery of the left gluteus maximus muscle, potentially residua of old trauma. Intramuscular regions otherwise appear normal. Review of the MIP images confirms the above findings. IMPRESSION: CT angiogram chest: 1. There is a moderate pericardial effusion. The attenuation of the fluid in the pericardium is higher than is expected with serous fluid. Exudative fluid secondary to infection or possible neoplasm is a differential consideration. Hemorrhage within the pericardium is a differential consideration as well. No contrast extravasation is seen extending into the pericardium. This finding may warrant echocardiography to further assess. 2. No thoracic aortic aneurysm or dissection evident. No pulmonary embolus seen with proviso that the timing bolus of the contrast is less than optimal to visualized more peripheral pulmonary arterial vessels. 3. Bibasilar atelectasis. No frank edema or consolidation. Occasional small calcified granulomas in the bases. 4.  There are foci of coronary artery calcification. 5.  No evident thoracic adenopathy. CT angiogram abdomen; CT angiogram pelvis: 1. No aneurysm or dissection involving the aorta or major mesenteric/pelvic arterial vessels. There are foci of atherosclerosis in the aorta as well as in multiple pelvic arterial vessels without hemodynamically significant obstruction in these areas. 2.  Ventral hernia containing fat but no bowel. 3. No hydronephrosis. No ureteral calculi. No renal calculi evident with the proviso that contrast in the collecting systems could obscure calculi. 4. No evident bowel obstruction. No abscess in the abdomen or pelvis. Appendix region appears normal. Aortic Atherosclerosis (ICD10-I70.0). These results were called by telephone at the time of interpretation on 12/07/2018 at 8:49 am to  [facility] JoplinDAYNA DUNN, PA, who verbally acknowledged these results. Electronically Signed   By: Bretta BangWilliam  Woodruff III M.D.   On: 12/07/2018 08:50    Assessment/Plan: S/P Procedure(s) (LRB): SUBXYPHOID PERICARDIAL WINDOW (N/A) TRANSESOPHAGEAL ECHOCARDIOGRAM (TEE) (N/A)  1 hemodyn stable 2 sinus tachy 3 keep tube in place for now 4 no lovenox, effus was bloody 5 BS poorly controlled, cont SSI- will need to resume insulin when begins to eat 6 not anemic 7 leukocytosis a little worse, follow clinically, probable inflammatory pericarditis- conts colchicine 8 wean BiPAP as able  LOS: 1 day    Rowe ClackWayne E Gold [facility] HospitalA-C 12/08/2018 Pager [phone]  I have seen and examined the patient and agree with the assessment and plan as outlined.  Leave pericardial tube in place for now.  No pharmacologic DVT prophylaxis due to high risk of recurrent bleeding.  Mobilize.  Purcell Nailslarence H Liev Brockbank, MD 12/08/2018 8:28 AM

## 2018-12-08 NOTE — Progress Notes (Signed)
Progress Note  Patient Name: Cody Erickson Date of Encounter: 12/08/2018  Primary Cardiologist: No primary care provider on file. Cody Erickson  Subjective   Chest pain improved.   Inpatient Medications    Scheduled Meds: . acetaminophen  1,000 mg Oral Q6H   Or  . acetaminophen (TYLENOL) oral liquid 160 mg/5 mL  1,000 mg Oral Q6H  . albuterol  2.5 mg Nebulization Q4H while awake  . bisacodyl  10 mg Oral Daily  . carvedilol  6.25 mg Oral BID WC  . colchicine  0.6 mg Oral BID  . insulin aspart  0-24 Units Subcutaneous Q4H  . pantoprazole  40 mg Oral Daily  . senna-docusate  1 tablet Oral QHS   Continuous Infusions: . sodium chloride 50 mL/hr at 12/08/18 0600  . potassium chloride     PRN Meds: carisoprodol, morphine injection, ondansetron (ZOFRAN) IV, oxyCODONE, potassium chloride, traMADol   Vital Signs    Vitals:   12/08/18 0500 12/08/18 0600 12/08/18 0610 12/08/18 0700  BP:      Pulse: (!) 109 (!) 107  (!) 108  Resp: 14 14  12   Temp:      TempSrc:      SpO2: 98% 99% 97% 100%  Weight:      Height:        Intake/Output Summary (Last 24 hours) at 12/08/2018 0754 Last data filed at 12/08/2018 0600 Gross per 24 hour  Intake 3159.62 ml  Output 1875 ml  Net 1284.62 ml   Filed Weights   12/07/18 0519 12/07/18 0730  Weight: (!) 181.4 kg (!) 183.9 kg    Telemetry    Sinus tach - Personally Reviewed  ECG    No AM EKG- Personally Reviewed  Physical Exam   GEN: Morbidly obese, on CPAP this am. NAD   Neck: No JVD Cardiac: Regular tachy. No murmurs, rubs, or gallops.  Respiratory: Clear to auscultation bilaterally. GI: Soft, nontender, non-distended  MS: No edema; No deformity. Neuro:  Nonfocal  Psych: Normal affect   Labs    Chemistry Recent Labs  Lab 12/07/18 0526 12/07/18 0630 12/08/18 0630  NA 140 138 136  K 3.7 3.8 4.3  CL 96* 96* 95*  CO2 32  --  28  GLUCOSE 104* 141* 252*  BUN 16 15 19   CREATININE 0.82 0.60* 1.04  CALCIUM 9.4  --   8.7*  PROT 7.7  --   --   ALBUMIN 4.4  --   --   AST 25  --   --   ALT 26  --   --   ALKPHOS 49  --   --   BILITOT 0.7  --   --   GFRNONAA >60  --  >60  GFRAA >60  --  >60  ANIONGAP 12  --  13     Hematology Recent Labs  Lab 12/07/18 0526 12/07/18 0630 12/08/18 0630  WBC 17.2*  --  18.9*  RBC 6.00*  --  5.48  HGB 16.8 17.3* 14.8  HCT 53.5* 51.0 48.2  MCV 89.2  --  88.0  MCH 28.0  --  27.0  MCHC 31.4  --  30.7  RDW 15.1  --  14.7  PLT 237  --  216    Cardiac Enzymes Recent Labs  Lab 12/07/18 0526 12/07/18 0731 12/07/18 1140  TROPONINI <0.03 <0.03 <0.03    Recent Labs  Lab 12/07/18 0553  TROPIPOC 0.00     BNP Recent Labs  Lab 12/07/18 0731  BNP 11.9     DDimer No results for input(s): DDIMER in the last 168 hours.   Radiology    Dg Chest Port 1 View  Result Date: 12/07/2018 CLINICAL DATA:  Postprocedure EXAM: PORTABLE CHEST 1 VIEW COMPARISON:  CT chest 12/07/2018 FINDINGS: Limited by body habitus. Right-sided central venous catheter tip poorly visible, likely over the SVC. No gross pneumothorax. Cardiomegaly without edema. IMPRESSION: 1. Limited by habitus. There appears to be right central venous catheter, the tip is poorly visible but suspected to overlie the SVC region. No gross pneumothorax 2. Cardiomegaly.  Low lung volume. Electronically Signed   By: Jasmine Pang M.D.   On: 12/07/2018 19:04   Ct Angio Chest/abd/pel For Dissection W And/or W/wo  Result Date: 12/07/2018 CLINICAL DATA:  Chest and abdominal pain EXAM: CT ANGIOGRAPHY CHEST, ABDOMEN AND PELVIS TECHNIQUE: Initially, axial CT images were obtained through the chest without intravenous contrast material administration. Multidetector CT imaging through the chest, abdomen and pelvis was performed using the standard protocol during bolus administration of intravenous contrast. Multiplanar reconstructed images and MIPs were obtained and reviewed to evaluate the vascular anatomy. CONTRAST:  100 mL  ISOVUE-370 IOPAMIDOL (ISOVUE-370) INJECTION 76% COMPARISON:  None. FINDINGS: CTA CHEST FINDINGS Cardiovascular: No intramural hematoma is seen in the thoracic aorta on noncontrast enhanced study. There is no apparent thoracic aortic aneurysm or dissection. The visualized great vessels appear unremarkable. There is no demonstrable pulmonary embolus. Timing of the contrast bolus is less than optimal for visualization of more peripheral pulmonary arterial vessels. There are foci of coronary artery calcification. There is a moderate pericardial effusion. The attenuation of the fluid in the pericardium is higher than is expected with serous fluid. Mediastinum/Nodes: Thyroid appears unremarkable. There is no appreciable thoracic adenopathy. No esophageal lesions are evident. Lungs/Pleura: There is atelectatic change in both lower lobe regions. There is no frank edema or consolidation. There is no appreciable pleural effusion or pleural thickening. There are a few small scattered granulomas in the lung bases. Musculoskeletal: There are no blastic or lytic bone lesions. There are foci of degenerative change in the thoracic spine. There are no chest wall lesions evident. Review of the MIP images confirms the above findings. CTA ABDOMEN AND PELVIS FINDINGS VASCULAR Aorta: There is no demonstrable abdominal aortic aneurysm or dissection. There are foci of aortic atherosclerosis. There is no hemodynamically significant obstruction in the abdominal aorta. Celiac: There is no aneurysm or dissection involving the celiac artery or its major branches. These vessels appear patent without appreciable obstructive disease. SMA: Superior mesenteric artery and its major branches appear patent. No aneurysm or dissection involving these vessels evident. Renals: There is a single renal artery on each side. Renal arteries and their branches show no aneurysm or dissection. No evident fibromuscular dysplasia. No appreciable vessel narrowing or  obstruction. IMA: Inferior mesenteric artery and its branches appear patent without appreciable obstructive disease. No aneurysm or dissection involving these vessels. Inflow: There is calcification in portions of both common iliac arteries without hemodynamically significant obstruction. There is approximately 50% diameter stenosis in each proximal internal iliac artery with calcification in these vessels. There is patchy calcification in both external iliac arteries without hemodynamically significant obstruction. There is mild atherosclerotic calcification in each common femoral artery without hemodynamically significant obstruction. Similar changes are noted in both proximal superficial femoral and profunda femoral arteries. No aneurysm or dissection involving these vessels evident. Veins: No obvious venous abnormality within the limitations of this arterial phase study. Review of the MIP  images confirms the above findings. NON-VASCULAR Hepatobiliary: No focal liver lesions are appreciable. Gallbladder wall is not appreciably thickened. There is no biliary duct dilatation. Pancreas: No pancreatic mass or inflammatory focus. Spleen: No splenic lesions are evident. Adrenals/Urinary Tract: Adrenals bilaterally appear normal. There is a cyst arising from the medial upper pole left kidney measuring 2.0 x 2.0 cm. No hydronephrosis is evident on either side. There is contrast in the collecting systems bilaterally which potentially could mask small calculi; no obvious calculi are appreciable in the kidneys. There is no evident ureteral calculus on either side. Urinary bladder is midline with wall thickness within normal limits. Stomach/Bowel: There is no appreciable bowel wall or mesenteric thickening. No evident bowel obstruction. No free air or portal venous air. Lymphatic: No adenopathy is evident in the abdomen or pelvis. Reproductive: Prostate and seminal vesicles appear normal in size and contour. No pelvic mass  evident. Other: There is a midline ventral hernia which contains fat but no bowel. The appendix region appears unremarkable without inflammation. There is no abscess or ascites in the abdomen or pelvis. Musculoskeletal: No evident blastic or lytic bone lesions. There is calcification along the periphery of the left gluteus maximus muscle, potentially residua of old trauma. Intramuscular regions otherwise appear normal. Review of the MIP images confirms the above findings. IMPRESSION: CT angiogram chest: 1. There is a moderate pericardial effusion. The attenuation of the fluid in the pericardium is higher than is expected with serous fluid. Exudative fluid secondary to infection or possible neoplasm is a differential consideration. Hemorrhage within the pericardium is a differential consideration as well. No contrast extravasation is seen extending into the pericardium. This finding may warrant echocardiography to further assess. 2. No thoracic aortic aneurysm or dissection evident. No pulmonary embolus seen with proviso that the timing bolus of the contrast is less than optimal to visualized more peripheral pulmonary arterial vessels. 3. Bibasilar atelectasis. No frank edema or consolidation. Occasional small calcified granulomas in the bases. 4.  There are foci of coronary artery calcification. 5.  No evident thoracic adenopathy. CT angiogram abdomen; CT angiogram pelvis: 1. No aneurysm or dissection involving the aorta or major mesenteric/pelvic arterial vessels. There are foci of atherosclerosis in the aorta as well as in multiple pelvic arterial vessels without hemodynamically significant obstruction in these areas. 2.  Ventral hernia containing fat but no bowel. 3. No hydronephrosis. No ureteral calculi. No renal calculi evident with the proviso that contrast in the collecting systems could obscure calculi. 4. No evident bowel obstruction. No abscess in the abdomen or pelvis. Appendix region appears normal.  Aortic Atherosclerosis (ICD10-I70.0). These results were called by telephone at the time of interpretation on 12/07/2018 at 8:49 am to [facility], who verbally acknowledged these results. Electronically Signed   By: Bretta Bang III M.D.   On: 12/07/2018 08:50    Cardiac Studies   Cardiac cath 12/07/18:  The left ventricular systolic function is normal.  LV end diastolic pressure is moderately elevated.  The left ventricular ejection fraction is 55-65% by visual estimate.   1. Mild nonobstructive CAD with calcification 2. Normal LV function 3. Moderately elevated LVEDP  Echo 12/07/18: - Left ventricle: The cavity size was normal. Wall thickness was   increased in a pattern of moderate LVH. Systolic function was   vigorous. The estimated ejection fraction was in the range of 65%   to 70%. Wall motion was normal; there were no regional wall   motion abnormalities. - Pericardium, extracardiac: A  moderate, free-flowing pericardial   effusion was identified circumferential to the heart. The right   ventricle mid to apical free wall demonstrates invagination (seen   in parasternal long and 4 axis view) suggestive of increased   intrapericardial pressure. There was evidence for increased RV-LV   interaction demonstrated by respirophasic changes in transmitral   velocities. Pericardial fluid is 5.2 cm from echo probe in 4   chamber view.  Urgent and Critical Findings:  Discussed with cardiology consult team. Impressions:  - Findings of tachycardia (HR 115bpm), moderate 1.7cm   circumferential effusion, mid to apical RV free wall invagination   and respirophasic changes in mitral valve inflow are suggestive   of increased intrapericardial pressure. The diagnosis of cardiac   tamponade still however remains a bedside diagnosis.  Patient Profile     52 y.o. male with history of morbid obesity, DM, HTN, HLD admitted with chest pain and dyspnea. EKG with ST elevation.  Cardiac cath with no obstructive CAD. Found to have a moderate to large pericardial effusion with early tamponade.   Assessment & Plan    1. Acute pericarditis/Pericardial effusion/cardiac tamponade: s/p subxyphoid pericardial window on 12/07/18. Bloody pericardial fluid removed (450 cc). Drain in place. CT surgery following. Will continue colchicine. Will start ibuprofen 800 mg po TID. Fluid studies pending.     For questions or updates, please contact [facility] Please consult [email or web address] for contact info under        Signed, Verne Carrow, MD  12/08/2018, 7:54 AM

## 2018-12-08 NOTE — Anesthesia Postprocedure Evaluation (Signed)
Anesthesia Post Note  Patient: Zara Councilichard Yoshida  Procedure(s) Performed: SUBXYPHOID PERICARDIAL WINDOW (N/A Chest) TRANSESOPHAGEAL ECHOCARDIOGRAM (TEE) (N/A )     Patient location during evaluation: PACU Anesthesia Type: General Level of consciousness: awake and alert Pain management: pain level controlled Vital Signs Assessment: post-procedure vital signs reviewed and stable Respiratory status: spontaneous breathing, nonlabored ventilation, respiratory function stable and patient connected to nasal cannula oxygen Cardiovascular status: blood pressure returned to baseline and stable Postop Assessment: no apparent nausea or vomiting Anesthetic complications: no    Last Vitals:  Vitals:   12/08/18 0755 12/08/18 0800  BP:    Pulse:  (!) 107  Resp:  13  Temp:    SpO2: 97% 98%    Last Pain:  Vitals:   12/08/18 0800  TempSrc:   PainSc: 2     LLE Motor Response: Purposeful movement (12/08/18 0756)   RLE Motor Response: Purposeful movement (12/08/18 0756)        Tayanna Talford S

## 2018-12-09 DIAGNOSIS — I5032 Chronic diastolic (congestive) heart failure: Secondary | ICD-10-CM

## 2018-12-09 DIAGNOSIS — I308 Other forms of acute pericarditis: Secondary | ICD-10-CM

## 2018-12-09 LAB — BPAM RBC
Blood Product Expiration Date: 202001042359
Blood Product Expiration Date: 202001082359
ISSUE DATE / TIME: 201912110648
Unit Type and Rh: 5100
Unit Type and Rh: 5100

## 2018-12-09 LAB — CBC
HCT: 45.3 % (ref 39.0–52.0)
HEMATOCRIT: 46 % (ref 39.0–52.0)
Hemoglobin: 13.9 g/dL (ref 13.0–17.0)
Hemoglobin: 14.3 g/dL (ref 13.0–17.0)
MCH: 26.5 pg (ref 26.0–34.0)
MCH: 27.6 pg (ref 26.0–34.0)
MCHC: 31.6 g/dL (ref 30.0–36.0)
MCHC: 30.2 g/dL (ref 30.0–36.0)
MCV: 87.3 fL (ref 80.0–100.0)
MCV: 87.6 fL (ref 80.0–100.0)
Platelets: 212 10*3/uL (ref 150–400)
Platelets: 225 10*3/uL (ref 150–400)
RBC: 5.19 MIL/uL (ref 4.22–5.81)
RBC: 5.25 MIL/uL (ref 4.22–5.81)
RDW: 14.8 % (ref 11.5–15.5)
RDW: 14.7 % (ref 11.5–15.5)
WBC: 18 10*3/uL — ABNORMAL HIGH (ref 4.0–10.5)
WBC: 18.8 10*3/uL — ABNORMAL HIGH (ref 4.0–10.5)
nRBC: 0 % (ref 0.0–0.2)
nRBC: 0 % (ref 0.0–0.2)

## 2018-12-09 LAB — TYPE AND SCREEN
ABO/RH(D): O POS
Antibody Screen: NEGATIVE
Unit division: 0
Unit division: 0

## 2018-12-09 LAB — GLUCOSE, BODY FLUID OTHER: GLUCOSE, BODY FLUID OTHER: 62 mg/dL

## 2018-12-09 LAB — COMPREHENSIVE METABOLIC PANEL
ALT: 18 U/L (ref 0–44)
AST: 17 U/L (ref 15–41)
Albumin: 3.1 g/dL — ABNORMAL LOW (ref 3.5–5.0)
Alkaline Phosphatase: 41 U/L (ref 38–126)
Anion gap: 10 (ref 5–15)
BUN: 16 mg/dL (ref 6–20)
CO2: 28 mmol/L (ref 22–32)
Calcium: 8.3 mg/dL — ABNORMAL LOW (ref 8.9–10.3)
Chloride: 98 mmol/L (ref 98–111)
Creatinine, Ser: 0.81 mg/dL (ref 0.61–1.24)
GFR calc Af Amer: >60 mL/min (ref >60)
Glucose, Bld: 191 mg/dL — ABNORMAL HIGH (ref 70–99)
Potassium: 4.2 mmol/L (ref 3.5–5.1)
Sodium: 136 mmol/L (ref 135–145)
Total Bilirubin: 0.8 mg/dL (ref 0.3–1.2)
Total Protein: 6.6 g/dL (ref 6.5–8.1)

## 2018-12-09 LAB — GLUCOSE, CAPILLARY
GLUCOSE-CAPILLARY: 177 mg/dL — AB (ref 70–99)
Glucose-Capillary: 150 mg/dL — ABNORMAL HIGH (ref 70–99)
Glucose-Capillary: 166 mg/dL — ABNORMAL HIGH (ref 70–99)
Glucose-Capillary: 180 mg/dL — ABNORMAL HIGH (ref 70–99)
Glucose-Capillary: 183 mg/dL — ABNORMAL HIGH (ref 70–99)
Glucose-Capillary: 215 mg/dL — ABNORMAL HIGH (ref 70–99)

## 2018-12-09 MED ORDER — HYDROCHLOROTHIAZIDE 25 MG PO TABS
25.0000 mg | ORAL_TABLET | Freq: Every day | ORAL | Status: DC
  Administered 2018-12-09 – 2018-12-10 (×2): 25 mg via ORAL
  Filled 2018-12-09 (×2): qty 1

## 2018-12-09 MED ORDER — LOSARTAN POTASSIUM 25 MG PO TABS
25.0000 mg | ORAL_TABLET | Freq: Every day | ORAL | Status: DC
  Administered 2018-12-09: 25 mg via ORAL
  Filled 2018-12-09 (×2): qty 1

## 2018-12-09 MED ORDER — CARVEDILOL 25 MG PO TABS
25.0000 mg | ORAL_TABLET | Freq: Two times a day (BID) | ORAL | Status: DC
  Administered 2018-12-09 – 2018-12-10 (×3): 25 mg via ORAL
  Filled 2018-12-09 (×3): qty 1

## 2018-12-09 NOTE — Progress Notes (Addendum)
[address]       [place] [address]             [phone]      2 Days Post-Op Procedure(s) (LRB): SUBXYPHOID PERICARDIAL WINDOW (N/A) TRANSESOPHAGEAL ECHOCARDIOGRAM (TEE) (N/A) Subjective: Some soreness , overall feels reasonably well  Objective: Vital signs in last 24 hours: Temp:  [97.7 F (36.5 C)-98.6 F (37 C)] 98.3 F (36.8 C) (12/13 0700) Pulse Rate:  [82-112] 95 (12/13 0700) Cardiac Rhythm: Sinus tachycardia (12/12 2000) Resp:  [13-21] 18 (12/13 0700) BP: (126-170)/(81-86) 126/81 (12/13 0700) SpO2:  [97 %-100 %] 100 % (12/13 0700) Arterial Line BP: (131-171)/(68-92) 151/79 (12/13 0700) FiO2 (%):  [40 %] 40 % (12/13 0000)  Hemodynamic parameters for last 24 hours:    Intake/Output from previous day: 12/12 0701 - 12/13 0700 In: 2280.6 [P.O.:1080; I.V.:1200.6] Out: 2335 [Urine:2285; Chest Tube:50] Intake/Output this shift: No intake/output data recorded.  General appearance: alert, cooperative and no distress Heart: regular rate and rhythm, no rub and tachy Lungs: min dim in the bases Abdomen: benign Extremities: + edema, chronic stasis Wound: incis- provena in place  Lab Results: Recent Labs    12/08/18 2345 12/09/18 0423  WBC 18.8* 18.0*  HGB 13.9 14.3  HCT 46.0 45.3  PLT 225 212   BMET:  Recent Labs    12/08/18 0630 12/09/18 0423  NA 136 136  K 4.3 4.2  CL 95* 98  CO2 28 28  GLUCOSE 252* 191*  BUN 19 16  CREATININE 1.04 0.81  CALCIUM 8.7* 8.3*    PT/INR:  Recent Labs    12/07/18 0526  LABPROT SPECIMEN CLOTTED  INR SPECIMEN CLOTTED   ABG    Component Value Date/Time   PHART 7.319 (L) 12/08/2018 0331   HCO3 30.4 (H) 12/08/2018 0331   TCO2 32 12/08/2018 0331   O2SAT 96.0 12/08/2018 0331   CBG (last 3)  Recent Labs    12/08/18 2344 12/09/18 0352 12/09/18 0750  GLUCAP 179* 177* 166*    Meds Scheduled Meds: . albuterol  2.5 mg Nebulization Q4H WA  . bisacodyl  10 mg Oral Daily  . carvedilol   25 mg Oral BID WC  . colchicine  0.6 mg Oral BID  . hydrochlorothiazide  25 mg Oral Daily  . ibuprofen  800 mg Oral TID WC  . insulin aspart  0-24 Units Subcutaneous Q4H  . insulin aspart  6 Units Subcutaneous TID WC  . insulin detemir  20 Units Subcutaneous BID  . losartan  25 mg Oral Daily  . pantoprazole  40 mg Oral Daily  . senna-docusate  1 tablet Oral QHS   Continuous Infusions: . potassium chloride     PRN Meds:.carisoprodol, morphine injection, ondansetron (ZOFRAN) IV, oxyCODONE, potassium chloride, traMADol  Xrays Dg Chest Port 1 View  Result Date: 12/07/2018 CLINICAL DATA:  Postprocedure EXAM: PORTABLE CHEST 1 VIEW COMPARISON:  CT chest 12/07/2018 FINDINGS: Limited by body habitus. Right-sided central venous catheter tip poorly visible, likely over the SVC. No gross pneumothorax. Cardiomegaly without edema. IMPRESSION: 1. Limited by habitus. There appears to be right central venous catheter, the tip is poorly visible but suspected to overlie the SVC region. No gross pneumothorax 2. Cardiomegaly.  Low lung volume. Electronically Signed   By: Jasmine Pang M.D.   On: 12/07/2018 19:04   Ct Angio Chest/abd/pel For Dissection W And/or W/wo  Result Date: 12/07/2018 CLINICAL DATA:  Chest and abdominal pain EXAM: CT ANGIOGRAPHY CHEST, ABDOMEN  AND PELVIS TECHNIQUE: Initially, axial CT images were obtained through the chest without intravenous contrast material administration. Multidetector CT imaging through the chest, abdomen and pelvis was performed using the standard protocol during bolus administration of intravenous contrast. Multiplanar reconstructed images and MIPs were obtained and reviewed to evaluate the vascular anatomy. CONTRAST:  100 mL ISOVUE-370 IOPAMIDOL (ISOVUE-370) INJECTION 76% COMPARISON:  None. FINDINGS: CTA CHEST FINDINGS Cardiovascular: No intramural hematoma is seen in the thoracic aorta on noncontrast enhanced study. There is no apparent thoracic aortic aneurysm or  dissection. The visualized great vessels appear unremarkable. There is no demonstrable pulmonary embolus. Timing of the contrast bolus is less than optimal for visualization of more peripheral pulmonary arterial vessels. There are foci of coronary artery calcification. There is a moderate pericardial effusion. The attenuation of the fluid in the pericardium is higher than is expected with serous fluid. Mediastinum/Nodes: Thyroid appears unremarkable. There is no appreciable thoracic adenopathy. No esophageal lesions are evident. Lungs/Pleura: There is atelectatic change in both lower lobe regions. There is no frank edema or consolidation. There is no appreciable pleural effusion or pleural thickening. There are a few small scattered granulomas in the lung bases. Musculoskeletal: There are no blastic or lytic bone lesions. There are foci of degenerative change in the thoracic spine. There are no chest wall lesions evident. Review of the MIP images confirms the above findings. CTA ABDOMEN AND PELVIS FINDINGS VASCULAR Aorta: There is no demonstrable abdominal aortic aneurysm or dissection. There are foci of aortic atherosclerosis. There is no hemodynamically significant obstruction in the abdominal aorta. Celiac: There is no aneurysm or dissection involving the celiac artery or its major branches. These vessels appear patent without appreciable obstructive disease. SMA: Superior mesenteric artery and its major branches appear patent. No aneurysm or dissection involving these vessels evident. Renals: There is a single renal artery on each side. Renal arteries and their branches show no aneurysm or dissection. No evident fibromuscular dysplasia. No appreciable vessel narrowing or obstruction. IMA: Inferior mesenteric artery and its branches appear patent without appreciable obstructive disease. No aneurysm or dissection involving these vessels. Inflow: There is calcification in portions of both common iliac arteries  without hemodynamically significant obstruction. There is approximately 50% diameter stenosis in each proximal internal iliac artery with calcification in these vessels. There is patchy calcification in both external iliac arteries without hemodynamically significant obstruction. There is mild atherosclerotic calcification in each common femoral artery without hemodynamically significant obstruction. Similar changes are noted in both proximal superficial femoral and profunda femoral arteries. No aneurysm or dissection involving these vessels evident. Veins: No obvious venous abnormality within the limitations of this arterial phase study. Review of the MIP images confirms the above findings. NON-VASCULAR Hepatobiliary: No focal liver lesions are appreciable. Gallbladder wall is not appreciably thickened. There is no biliary duct dilatation. Pancreas: No pancreatic mass or inflammatory focus. Spleen: No splenic lesions are evident. Adrenals/Urinary Tract: Adrenals bilaterally appear normal. There is a cyst arising from the medial upper pole left kidney measuring 2.0 x 2.0 cm. No hydronephrosis is evident on either side. There is contrast in the collecting systems bilaterally which potentially could mask small calculi; no obvious calculi are appreciable in the kidneys. There is no evident ureteral calculus on either side. Urinary bladder is midline with wall thickness within normal limits. Stomach/Bowel: There is no appreciable bowel wall or mesenteric thickening. No evident bowel obstruction. No free air or portal venous air. Lymphatic: No adenopathy is evident in the abdomen or pelvis. Reproductive: Prostate  and seminal vesicles appear normal in size and contour. No pelvic mass evident. Other: There is a midline ventral hernia which contains fat but no bowel. The appendix region appears unremarkable without inflammation. There is no abscess or ascites in the abdomen or pelvis. Musculoskeletal: No evident blastic or  lytic bone lesions. There is calcification along the periphery of the left gluteus maximus muscle, potentially residua of old trauma. Intramuscular regions otherwise appear normal. Review of the MIP images confirms the above findings. IMPRESSION: CT angiogram chest: 1. There is a moderate pericardial effusion. The attenuation of the fluid in the pericardium is higher than is expected with serous fluid. Exudative fluid secondary to infection or possible neoplasm is a differential consideration. Hemorrhage within the pericardium is a differential consideration as well. No contrast extravasation is seen extending into the pericardium. This finding may warrant echocardiography to further assess. 2. No thoracic aortic aneurysm or dissection evident. No pulmonary embolus seen with proviso that the timing bolus of the contrast is less than optimal to visualized more peripheral pulmonary arterial vessels. 3. Bibasilar atelectasis. No frank edema or consolidation. Occasional small calcified granulomas in the bases. 4.  There are foci of coronary artery calcification. 5.  No evident thoracic adenopathy. CT angiogram abdomen; CT angiogram pelvis: 1. No aneurysm or dissection involving the aorta or major mesenteric/pelvic arterial vessels. There are foci of atherosclerosis in the aorta as well as in multiple pelvic arterial vessels without hemodynamically significant obstruction in these areas. 2.  Ventral hernia containing fat but no bowel. 3. No hydronephrosis. No ureteral calculi. No renal calculi evident with the proviso that contrast in the collecting systems could obscure calculi. 4. No evident bowel obstruction. No abscess in the abdomen or pelvis. Appendix region appears normal. Aortic Atherosclerosis (ICD10-I70.0). These results were called by telephone at the time of interpretation on 12/07/2018 at 8:49 am to [facility], [facility], who verbally acknowledged these results. Electronically Signed   By: Bretta Bang III  M.D.   On: 12/07/2018 08:50   Results for orders placed or performed during the hospital encounter of 12/07/18  MRSA PCR Screening     Status: None   Collection Time: 12/07/18  8:45 AM  Result Value Ref Range Status   MRSA by PCR NEGATIVE NEGATIVE Final    Comment:        The GeneXpert MRSA Assay (FDA approved for NASAL specimens only), is one component of a comprehensive MRSA colonization surveillance program. It is not intended to diagnose MRSA infection nor to guide or monitor treatment for MRSA infections. Performed at [facility], [address], [place], [place] [address]   Body fluid culture     Status: None (Preliminary result)   Collection Time: 12/07/18  4:21 PM  Result Value Ref Range Status   Specimen Description FLUID PERICARDIAL  Final   Special Requests NONE  Final   Gram Stain   Final    ABUNDANT WBC PRESENT, PREDOMINANTLY PMN NO ORGANISMS SEEN    Culture   Final    NO GROWTH < 24 HOURS Performed at [facility], [address] [place], [place] [address]    Report Status PENDING  Incomplete  Acid Fast Smear (AFB)     Status: None   Collection Time: 12/07/18  4:21 PM  Result Value Ref Range Status   AFB Specimen Processing Concentration  Final   Acid Fast Smear Negative  Final    Comment: (NOTE) Performed At: [facility] [address] [place], [place] [id number] Jolene Schimke MD WU:[id number]    Source (AFB) FLUID  Final    Comment: PERICARDIAL     Assessment/Plan: S/P Procedure(s) (LRB): SUBXYPHOID PERICARDIAL WINDOW (N/A) TRANSESOPHAGEAL ECHOCARDIOGRAM (TEE) (N/A)  1 doing well  2 hemodyn stable with sinus tachy- Coreg increased and cozaar restarted 3 sugars a little better with levemir added yesterday 4 CT 130 yesterday- keep for now, hopefully d/c tomorrow or later today 5 sats good on RA 6 leukocytosis is stable at 16K 7 not anemic 8 renal fxn good, excellent diuresis 9 d/c aline and foley 10 primary management  per cardiology  LOS: 2 days    Rowe Clack [facility] 12/09/2018 Pager [phone]   I have seen and examined the patient and agree with the assessment and plan as outlined.  D/C tube.  Await cytology results.  Purcell Nails, MD 12/09/2018 8:46 AM   Addendum:  Cytology results negative - mixed inflammatory cells.  Purcell Nails, MD 12/09/2018 5:01 PM

## 2018-12-09 NOTE — Progress Notes (Signed)
Progress Note  Patient Name: Cody Erickson Date of Encounter: 12/09/2018  Primary Cardiologist: No primary care provider on file. [place]  Subjective   Chest is sore around drain. Overall chest pain is much improved. No dyspnea.   Inpatient Medications    Scheduled Meds: . albuterol  2.5 mg Nebulization Q4H WA  . bisacodyl  10 mg Oral Daily  . carvedilol  6.25 mg Oral BID WC  . colchicine  0.6 mg Oral BID  . ibuprofen  800 mg Oral TID WC  . insulin aspart  0-24 Units Subcutaneous Q4H  . insulin aspart  6 Units Subcutaneous TID WC  . insulin detemir  20 Units Subcutaneous BID  . pantoprazole  40 mg Oral Daily  . senna-docusate  1 tablet Oral QHS   Continuous Infusions: . sodium chloride 50 mL/hr at 12/09/18 0600  . potassium chloride     PRN Meds: carisoprodol, morphine injection, ondansetron (ZOFRAN) IV, oxyCODONE, potassium chloride, traMADol   Vital Signs    Vitals:   12/09/18 0354 12/09/18 0400 12/09/18 0500 12/09/18 0600  BP: (!) 170/83     Pulse:  (!) 107 96   Resp:  18 16 15   Temp: 97.9 F (36.6 C)     TempSrc: Axillary     SpO2:  100% 100% 98%  Weight:      Height:        Intake/Output Summary (Last 24 hours) at 12/09/2018 0731 Last data filed at 12/09/2018 0600 Gross per 24 hour  Intake 2280.56 ml  Output 2335 ml  Net -54.44 ml   Filed Weights   12/07/18 0519 12/07/18 0730  Weight: (!) 181.4 kg (!) 183.9 kg    Telemetry    Sinus tach with PACs- Personally Reviewed  ECG    No AM EKG- Personally Reviewed  Physical Exam   General: Obese male in NAD.  HEENT: OP clear, mucus membranes moist  SKIN: warm, dry. No rashes. Neuro: No focal deficits  Musculoskeletal: Muscle strength 5/5 all ext  Psychiatric: Mood and affect normal  Neck: No JVD, no carotid bruits, no thyromegaly, no lymphadenopathy.  Lungs:Clear bilaterally, no wheezes, rhonci, crackles Cardiovascular: Regular rate and rhythm. No murmurs, gallops or  rubs. Abdomen:Soft. Bowel sounds present. Non-tender.  Extremities: 1+ bilateral LE edema with chronic venous stasis changes.    Labs    Chemistry Recent Labs  Lab 12/07/18 0526 12/07/18 0630 12/08/18 0630 12/09/18 0423  NA 140 138 136 136  K 3.7 3.8 4.3 4.2  CL 96* 96* 95* 98  CO2 32  --  28 28  GLUCOSE 104* 141* 252* 191*  BUN 16 15 19 16   CREATININE 0.82 0.60* 1.04 0.81  CALCIUM 9.4  --  8.7* 8.3*  PROT 7.7  --   --  6.6  ALBUMIN 4.4  --   --  3.1*  AST 25  --   --  17  ALT 26  --   --  18  ALKPHOS 49  --   --  41  BILITOT 0.7  --   --  0.8  GFRNONAA >60  --  >60 >60  GFRAA >60  --  >60 >60  ANIONGAP 12  --  13 10     Hematology Recent Labs  Lab 12/08/18 0630 12/08/18 2345 12/09/18 0423  WBC 18.9* 18.8* 18.0*  RBC 5.48 5.25 5.19  HGB 14.8 13.9 14.3  HCT 48.2 46.0 45.3  MCV 88.0 87.6 87.3  MCH 27.0 26.5 27.6  MCHC  30.7 30.2 31.6  RDW 14.7 14.7 14.8  PLT 216 225 212    Cardiac Enzymes Recent Labs  Lab 12/07/18 0526 12/07/18 0731 12/07/18 1140  TROPONINI <0.03 <0.03 <0.03    Recent Labs  Lab 12/07/18 0553  TROPIPOC 0.00     BNP Recent Labs  Lab 12/07/18 0731  BNP 11.9     DDimer No results for input(s): DDIMER in the last 168 hours.   Radiology    Dg Chest Port 1 View  Result Date: 12/07/2018 CLINICAL DATA:  Postprocedure EXAM: PORTABLE CHEST 1 VIEW COMPARISON:  CT chest 12/07/2018 FINDINGS: Limited by body habitus. Right-sided central venous catheter tip poorly visible, likely over the SVC. No gross pneumothorax. Cardiomegaly without edema. IMPRESSION: 1. Limited by habitus. There appears to be right central venous catheter, the tip is poorly visible but suspected to overlie the SVC region. No gross pneumothorax 2. Cardiomegaly.  Low lung volume. Electronically Signed   By: Jasmine Pang M.D.   On: 12/07/2018 19:04   Ct Angio Chest/abd/pel For Dissection W And/or W/wo  Result Date: 12/07/2018 CLINICAL DATA:  Chest and abdominal pain  EXAM: CT ANGIOGRAPHY CHEST, ABDOMEN AND PELVIS TECHNIQUE: Initially, axial CT images were obtained through the chest without intravenous contrast material administration. Multidetector CT imaging through the chest, abdomen and pelvis was performed using the standard protocol during bolus administration of intravenous contrast. Multiplanar reconstructed images and MIPs were obtained and reviewed to evaluate the vascular anatomy. CONTRAST:  100 mL ISOVUE-370 IOPAMIDOL (ISOVUE-370) INJECTION 76% COMPARISON:  None. FINDINGS: CTA CHEST FINDINGS Cardiovascular: No intramural hematoma is seen in the thoracic aorta on noncontrast enhanced study. There is no apparent thoracic aortic aneurysm or dissection. The visualized great vessels appear unremarkable. There is no demonstrable pulmonary embolus. Timing of the contrast bolus is less than optimal for visualization of more peripheral pulmonary arterial vessels. There are foci of coronary artery calcification. There is a moderate pericardial effusion. The attenuation of the fluid in the pericardium is higher than is expected with serous fluid. Mediastinum/Nodes: Thyroid appears unremarkable. There is no appreciable thoracic adenopathy. No esophageal lesions are evident. Lungs/Pleura: There is atelectatic change in both lower lobe regions. There is no frank edema or consolidation. There is no appreciable pleural effusion or pleural thickening. There are a few small scattered granulomas in the lung bases. Musculoskeletal: There are no blastic or lytic bone lesions. There are foci of degenerative change in the thoracic spine. There are no chest wall lesions evident. Review of the MIP images confirms the above findings. CTA ABDOMEN AND PELVIS FINDINGS VASCULAR Aorta: There is no demonstrable abdominal aortic aneurysm or dissection. There are foci of aortic atherosclerosis. There is no hemodynamically significant obstruction in the abdominal aorta. Celiac: There is no aneurysm or  dissection involving the celiac artery or its major branches. These vessels appear patent without appreciable obstructive disease. SMA: Superior mesenteric artery and its major branches appear patent. No aneurysm or dissection involving these vessels evident. Renals: There is a single renal artery on each side. Renal arteries and their branches show no aneurysm or dissection. No evident fibromuscular dysplasia. No appreciable vessel narrowing or obstruction. IMA: Inferior mesenteric artery and its branches appear patent without appreciable obstructive disease. No aneurysm or dissection involving these vessels. Inflow: There is calcification in portions of both common iliac arteries without hemodynamically significant obstruction. There is approximately 50% diameter stenosis in each proximal internal iliac artery with calcification in these vessels. There is patchy calcification in both external iliac  arteries without hemodynamically significant obstruction. There is mild atherosclerotic calcification in each common femoral artery without hemodynamically significant obstruction. Similar changes are noted in both proximal superficial femoral and profunda femoral arteries. No aneurysm or dissection involving these vessels evident. Veins: No obvious venous abnormality within the limitations of this arterial phase study. Review of the MIP images confirms the above findings. NON-VASCULAR Hepatobiliary: No focal liver lesions are appreciable. Gallbladder wall is not appreciably thickened. There is no biliary duct dilatation. Pancreas: No pancreatic mass or inflammatory focus. Spleen: No splenic lesions are evident. Adrenals/Urinary Tract: Adrenals bilaterally appear normal. There is a cyst arising from the medial upper pole left kidney measuring 2.0 x 2.0 cm. No hydronephrosis is evident on either side. There is contrast in the collecting systems bilaterally which potentially could mask small calculi; no obvious calculi  are appreciable in the kidneys. There is no evident ureteral calculus on either side. Urinary bladder is midline with wall thickness within normal limits. Stomach/Bowel: There is no appreciable bowel wall or mesenteric thickening. No evident bowel obstruction. No free air or portal venous air. Lymphatic: No adenopathy is evident in the abdomen or pelvis. Reproductive: Prostate and seminal vesicles appear normal in size and contour. No pelvic mass evident. Other: There is a midline ventral hernia which contains fat but no bowel. The appendix region appears unremarkable without inflammation. There is no abscess or ascites in the abdomen or pelvis. Musculoskeletal: No evident blastic or lytic bone lesions. There is calcification along the periphery of the left gluteus maximus muscle, potentially residua of old trauma. Intramuscular regions otherwise appear normal. Review of the MIP images confirms the above findings. IMPRESSION: CT angiogram chest: 1. There is a moderate pericardial effusion. The attenuation of the fluid in the pericardium is higher than is expected with serous fluid. Exudative fluid secondary to infection or possible neoplasm is a differential consideration. Hemorrhage within the pericardium is a differential consideration as well. No contrast extravasation is seen extending into the pericardium. This finding may warrant echocardiography to further assess. 2. No thoracic aortic aneurysm or dissection evident. No pulmonary embolus seen with proviso that the timing bolus of the contrast is less than optimal to visualized more peripheral pulmonary arterial vessels. 3. Bibasilar atelectasis. No frank edema or consolidation. Occasional small calcified granulomas in the bases. 4.  There are foci of coronary artery calcification. 5.  No evident thoracic adenopathy. CT angiogram abdomen; CT angiogram pelvis: 1. No aneurysm or dissection involving the aorta or major mesenteric/pelvic arterial vessels. There  are foci of atherosclerosis in the aorta as well as in multiple pelvic arterial vessels without hemodynamically significant obstruction in these areas. 2.  Ventral hernia containing fat but no bowel. 3. No hydronephrosis. No ureteral calculi. No renal calculi evident with the proviso that contrast in the collecting systems could obscure calculi. 4. No evident bowel obstruction. No abscess in the abdomen or pelvis. Appendix region appears normal. Aortic Atherosclerosis (ICD10-I70.0). These results were called by telephone at the time of interpretation on 12/07/2018 at 8:49 am to [facility], [facility], who verbally acknowledged these results. Electronically Signed   By: Bretta Bang III M.D.   On: 12/07/2018 08:50    Cardiac Studies   Cardiac cath 12/07/18:  The left ventricular systolic function is normal.  LV end diastolic pressure is moderately elevated.  The left ventricular ejection fraction is 55-65% by visual estimate.   1. Mild nonobstructive CAD with calcification 2. Normal LV function 3. Moderately elevated LVEDP  Echo 12/07/18: -  Left ventricle: The cavity size was normal. Wall thickness was   increased in a pattern of moderate LVH. Systolic function was   vigorous. The estimated ejection fraction was in the range of 65%   to 70%. Wall motion was normal; there were no regional wall   motion abnormalities. - Pericardium, extracardiac: A moderate, free-flowing pericardial   effusion was identified circumferential to the heart. The right   ventricle mid to apical free wall demonstrates invagination (seen   in parasternal long and 4 axis view) suggestive of increased   intrapericardial pressure. There was evidence for increased RV-LV   interaction demonstrated by respirophasic changes in transmitral   velocities. Pericardial fluid is 5.2 cm from echo probe in 4   chamber view.  Urgent and Critical Findings:  Discussed with cardiology consult team. Impressions:  - Findings of  tachycardia (HR 115bpm), moderate 1.7cm   circumferential effusion, mid to apical RV free wall invagination   and respirophasic changes in mitral valve inflow are suggestive   of increased intrapericardial pressure. The diagnosis of cardiac   tamponade still however remains a bedside diagnosis.  Patient Profile     52 y.o. male with history of morbid obesity, DM, HTN, HLD admitted with chest pain and dyspnea. EKG with ST elevation. Cardiac cath with no obstructive CAD. Found to have a moderate to large pericardial effusion with early tamponade.   Assessment & Plan    1. Acute pericarditis/Pericardial effusion/cardiac tamponade: s/p subxyphoid pericardial window on 12/07/18. Bloody pericardial fluid removed (450 cc). Drain remains in place. CT surgery following. Continue colchicine and ibuprofen.  Fluid cultures negative. Cytology pending.   2. HTN: BP elevated overnight. Will increase Coreg back to 25 mg BID and add back Cozaar 25 mg daily.   3. Diabetes mellitus: Will continue Levemir 20 units BID.    4. Chronic diastolic CHF: Mild LE edema. Chronic per pt. Will resume HCTZ today at home dose. Reassess need for Lasix tomorrow.   Remove foley.    For questions or updates, please contact [facility] Please consult [email or web address] for contact info under        Signed, Verne Carrowhristopher McAlhany, MD  12/09/2018, 7:31 AM

## 2018-12-10 DIAGNOSIS — I3139 Other pericardial effusion (noninflammatory): Secondary | ICD-10-CM

## 2018-12-10 DIAGNOSIS — I314 Cardiac tamponade: Secondary | ICD-10-CM

## 2018-12-10 DIAGNOSIS — I313 Pericardial effusion (noninflammatory): Secondary | ICD-10-CM

## 2018-12-10 LAB — BASIC METABOLIC PANEL
Anion gap: 12 (ref 5–15)
BUN: 14 mg/dL (ref 6–20)
CO2: 27 mmol/L (ref 22–32)
Calcium: 8.7 mg/dL — ABNORMAL LOW (ref 8.9–10.3)
Chloride: 98 mmol/L (ref 98–111)
Creatinine, Ser: 0.74 mg/dL (ref 0.61–1.24)
GFR calc Af Amer: >60 mL/min (ref >60)
GFR calc non Af Amer: >60 mL/min (ref >60)
Glucose, Bld: 181 mg/dL — ABNORMAL HIGH (ref 70–99)
Potassium: 3.9 mmol/L (ref 3.5–5.1)
SODIUM: 137 mmol/L (ref 135–145)

## 2018-12-10 LAB — GLUCOSE, CAPILLARY
GLUCOSE-CAPILLARY: 128 mg/dL — AB (ref 70–99)
Glucose-Capillary: 123 mg/dL — ABNORMAL HIGH (ref 70–99)
Glucose-Capillary: 187 mg/dL — ABNORMAL HIGH (ref 70–99)

## 2018-12-10 LAB — BODY FLUID CULTURE: Culture: NO GROWTH

## 2018-12-10 MED ORDER — COLCHICINE 0.6 MG PO TABS: 0.6000 mg | ORAL_TABLET | Freq: Two times a day (BID) | ORAL | 0 refills | Status: DC

## 2018-12-10 MED ORDER — IBUPROFEN 800 MG PO TABS: 800.0000 mg | ORAL_TABLET | Freq: Three times a day (TID) | ORAL | 0 refills | Status: DC

## 2018-12-10 NOTE — Progress Notes (Signed)
Discharge instructions (medication changes and follow up appointments) given to patient. Wound Vac removed. IV removed and catheter intact.

## 2018-12-10 NOTE — Progress Notes (Addendum)
      [address]       [place] [id number]             [phone]      3 Days Post-Op Procedure(s) (LRB): SUBXYPHOID PERICARDIAL WINDOW (N/A) TRANSESOPHAGEAL ECHOCARDIOGRAM (TEE) (N/A) Subjective: Feels sore  Objective: Vital signs in last 24 hours: Temp:  [98 F (36.7 C)-98.6 F (37 C)] 98 F (36.7 C) (12/14 0835) Pulse Rate:  [72-103] 95 (12/14 0843) Cardiac Rhythm: Normal sinus rhythm (12/14 0843) Resp:  [11-22] 17 (12/14 0843) BP: (94-141)/(53-86) 141/86 (12/14 0835) SpO2:  [89 %-100 %] 96 % (12/14 0843) Arterial Line BP: (121)/(77) 121/77 (12/13 1200) Weight:  [183.2 kg] 183.2 kg (12/14 0300)  Hemodynamic parameters for last 24 hours:    Intake/Output from previous day: 12/13 0701 - 12/14 0700 In: 960 [P.O.:960] Out: 250 [Urine:230; Chest Tube:20] Intake/Output this shift: Total I/O In: 240 [P.O.:240] Out: 0   General appearance: alert, cooperative and no distress Heart: regular rate and rhythm Lungs: clear to auscultation bilaterally Abdomen: obese, nontender Extremities: chronic stasis/edema Wound: provena in place  Lab Results: Recent Labs    12/08/18 2345 12/09/18 0423  WBC 18.8* 18.0*  HGB 13.9 14.3  HCT 46.0 45.3  PLT 225 212   BMET:  Recent Labs    12/09/18 0423 12/10/18 1013  NA 136 137  K 4.2 3.9  CL 98 98  CO2 28 27  GLUCOSE 191* 181*  BUN 16 14  CREATININE 0.81 0.74  CALCIUM 8.3* 8.7*    PT/INR: No results for input(s): LABPROT, INR in the last 72 hours. ABG    Component Value Date/Time   PHART 7.319 (L) 12/08/2018 0331   HCO3 30.4 (H) 12/08/2018 0331   TCO2 32 12/08/2018 0331   O2SAT 96.0 12/08/2018 0331   CBG (last 3)  Recent Labs    12/09/18 2333 12/10/18 0343 12/10/18 0832  GLUCAP 150* 128* 123*    Meds Scheduled Meds: . albuterol  2.5 mg Nebulization Q4H WA  . bisacodyl  10 mg Oral Daily  . carvedilol  25 mg Oral BID WC  . colchicine  0.6 mg Oral BID  . hydrochlorothiazide  25 mg  Oral Daily  . ibuprofen  800 mg Oral TID WC  . insulin aspart  0-24 Units Subcutaneous Q4H  . insulin aspart  6 Units Subcutaneous TID WC  . insulin detemir  20 Units Subcutaneous BID  . losartan  25 mg Oral Daily  . pantoprazole  40 mg Oral Daily  . senna-docusate  1 tablet Oral QHS   Continuous Infusions: . potassium chloride     PRN Meds:.carisoprodol, morphine injection, ondansetron (ZOFRAN) IV, oxyCODONE, potassium chloride, traMADol  Xrays No results found.  Assessment/Plan: S/P Procedure(s) (LRB): SUBXYPHOID PERICARDIAL WINDOW (N/A) TRANSESOPHAGEAL ECHOCARDIOGRAM (TEE) (N/A)  1 stable from surgical perspective- d/c per cardiology today, can remove provena 2 will have office contact patient with f/u appts   LOS: 3 days    Rowe ClackWayne E Gold [facility] 12/10/2018 Pager [phone]   I have seen and examined the patient and agree with the assessment and plan as outlined.  Purcell Nailslarence H Emani Morad, MD 12/10/2018 1:31 PM

## 2018-12-10 NOTE — Plan of Care (Signed)

## 2018-12-10 NOTE — Progress Notes (Signed)
Progress Note  Patient Name: Cody Erickson Date of Encounter: 12/10/2018  Primary Cardiologist: Peter [name], MD   Subjective   He states he is ready to go home.  No significant chest pain or shortness of breath.  Inpatient Medications    Scheduled Meds: . albuterol  2.5 mg Nebulization Q4H WA  . bisacodyl  10 mg Oral Daily  . carvedilol  25 mg Oral BID WC  . colchicine  0.6 mg Oral BID  . hydrochlorothiazide  25 mg Oral Daily  . ibuprofen  800 mg Oral TID WC  . insulin aspart  0-24 Units Subcutaneous Q4H  . insulin aspart  6 Units Subcutaneous TID WC  . insulin detemir  20 Units Subcutaneous BID  . losartan  25 mg Oral Daily  . pantoprazole  40 mg Oral Daily  . senna-docusate  1 tablet Oral QHS   Continuous Infusions: . potassium chloride     PRN Meds: carisoprodol, morphine injection, ondansetron (ZOFRAN) IV, oxyCODONE, potassium chloride, traMADol   Vital Signs    Vitals:   12/10/18 0400 12/10/18 0751 12/10/18 0835 12/10/18 0843  BP: 106/64  (!) 141/86   Pulse: 91  99 95  Resp: 16  17 17   Temp: 98.3 F (36.8 C)  98 F (36.7 C)   TempSrc: Oral  Oral   SpO2: 96% 97% 98% 96%  Weight:      Height:        Intake/Output Summary (Last 24 hours) at 12/10/2018 1132 Last data filed at 12/10/2018 0700 Gross per 24 hour  Intake 720 ml  Output 0 ml  Net 720 ml   Filed Weights   12/07/18 0519 12/07/18 0730 12/10/18 0300  Weight: (!) 181.4 kg (!) 183.9 kg (!) 183.2 kg    Telemetry    Sinus rhythm in the upper 90s- Personally Reviewed  ECG    No new- Personally Reviewed  Physical Exam   GEN: No acute distress.  Morbidly obese Neck: No JVD right IJ in place Cardiac: RRR, no murmurs, rubs, or gallops.  Dressing with suction intact Respiratory: Clear to auscultation bilaterally. GI: Soft, nontender, non-distended  MS:  Chronic appearing lower extremity 2+ edema with pigmentation; No deformity. Neuro:  Nonfocal  Psych: Normal affect   Labs      Chemistry Recent Labs  Lab 12/07/18 0526  12/08/18 0630 12/09/18 0423 12/10/18 1013  NA 140   < > 136 136 137  K 3.7   < > 4.3 4.2 3.9  CL 96*   < > 95* 98 98  CO2 32  --  28 28 27   GLUCOSE 104*   < > 252* 191* 181*  BUN 16   < > 19 16 14   CREATININE 0.82   < > 1.04 0.81 0.74  CALCIUM 9.4  --  8.7* 8.3* 8.7*  PROT 7.7  --   --  6.6  --   ALBUMIN 4.4  --   --  3.1*  --   AST 25  --   --  17  --   ALT 26  --   --  18  --   ALKPHOS 49  --   --  41  --   BILITOT 0.7  --   --  0.8  --   GFRNONAA >60  --  >60 >60 >60  GFRAA >60  --  >60 >60 >60  ANIONGAP 12  --  13 10 12    < > = values in this interval  not displayed.     Hematology Recent Labs  Lab 12/08/18 0630 12/08/18 2345 12/09/18 0423  WBC 18.9* 18.8* 18.0*  RBC 5.48 5.25 5.19  HGB 14.8 13.9 14.3  HCT 48.2 46.0 45.3  MCV 88.0 87.6 87.3  MCH 27.0 26.5 27.6  MCHC 30.7 30.2 31.6  RDW 14.7 14.7 14.8  PLT 216 225 212    Cardiac Enzymes Recent Labs  Lab 12/07/18 0526 12/07/18 0731 12/07/18 1140  TROPONINI <0.03 <0.03 <0.03    Recent Labs  Lab 12/07/18 0553  TROPIPOC 0.00     BNP Recent Labs  Lab 12/07/18 0731  BNP 11.9     DDimer No results for input(s): DDIMER in the last 168 hours.   Radiology    No results found.  Cardiac Studies   Pericardial fluid was bloody however there was no evidence of malignancy.  Cardiac cath showed mild nonobstructive CAD with calcification normal LV function and elevated LVEDP  Echocardiogram on 12/07/2018 demonstrated findings concerning for elevated intrapericardial pressure, moderate to severe pericardial effusion.  Pericarditis    Patient Profile     52 y.o. male with acute pericarditis status post subxiphoid window, pericardial drain, bloody effusion, no malignancy, morbid obesity with diabetes with hypertension hyperlipidemia, mild nonobstructive CAD  Assessment & Plan    Acute pericarditis, pericardial effusion, cardiac tamponade - Subxiphoid  pericardial window on 12/07/2018 with 400 cc of bloody pericardial fluid removed.  Drain has been removed.  Dressing intact.  Fluid cultures are negative and cytology is also negative.  No evidence of malignancy. - Continue with colchicine for 3 months and ibuprofen for the next 10 days, 800 mg p.o. 3 times daily with meals.  -Cardiac catheterization reassuring with only mild nonobstructive disease.  Diabetes with morbid obesity hypertension and chronic diastolic heart failure - He is back on HCTZ.  Carvedilol and Cozaar back. -Continue to encourage weight loss BMI 60.  He is at high risk for further cardiovascular/orthopedic complications in the future.  We will discontinue his right IJ.  Okay for discharge.  For questions or updates, please contact [facility] Please consult [email or web address] for contact info under        Signed, Donato Schultz, MD  12/10/2018, 11:32 AM

## 2018-12-10 NOTE — Discharge Instructions (Signed)
Clean incision daily gently with soap and water.  You may shower.  Do not take a tub bath. Contact the surgeon's office for temperature greater than 101.   Continue to use your incentive spirometer at home every hour while awake x10. Contact the office if the incision gets red or have any drainage that appears to be like pus. No heavy lifting. No driving while taking pain medication.

## 2018-12-10 NOTE — Discharge Summary (Addendum)
Discharge Summary    Patient ID: Cody Erickson MRN: [id number]; DOB: [birth year]  Admit date: 12/07/2018 Discharge date: 12/10/2018  Primary Care Provider: Janece Canterbury, MD  Primary Cardiologist: Peter Swaziland, MD   Discharge Diagnoses    Principal Problem:   Pericardial effusion with cardiac tamponade Active Problems:   Diabetes mellitus type 2 in obese (HCC)   HTN (hypertension)   Hyperlipidemia   Morbid obesity (HCC)   Allergies Allergies  Allergen Reactions  . Amoxicillin Hives  . Metformin And Related     sick  . Simvastatin-High Dose     Elevated LFTs  . Canagliflozin Diarrhea    (invokana)  . Exenatide Nausea Only    (byetta)  . Gemfibrozil Other (See Comments)    Diagnostic Studies/Procedures    Cardiac cath 12/07/18:  The left ventricular systolic function is normal.  LV end diastolic pressure is moderately elevated.  The left ventricular ejection fraction is 55-65% by visual estimate.  1. Mild nonobstructive CAD with calcification 2. Normal LV function 3. Moderately elevated LVEDP  ______________  Echo 12/07/18: - Left ventricle: The cavity size was normal. Wall thickness was increased in a pattern of moderate LVH. Systolic function was vigorous. The estimated ejection fraction was in the range of 65% to 70%. Wall motion was normal; there were no regional wall motion abnormalities. - Pericardium, extracardiac: A moderate, free-flowing pericardial effusion was identified circumferential to the heart. The right ventricle mid to apical free wall demonstrates invagination (seen in parasternal long and 4 axis view) suggestive of increased intrapericardial pressure. There was evidence for increased RV-LV interaction demonstrated by respirophasic changes in transmitral velocities. Pericardial fluid is 5.2 cm from echo probe in 4 chamber view.  Urgent and Critical Findings: Discussed with cardiology  consult team. Impressions:  - Findings of tachycardia (HR 115bpm), moderate 1.7cm circumferential effusion, mid to apical RV free wall invagination and respirophasic changes in mitral valve inflow are suggestive of increased intrapericardial pressure. The diagnosis of cardiac tamponade still however remains a bedside diagnosis.    History of Present Illness     Cody Erickson is a 52 y.o. male with a history of morbid obesity, DM, HTN, HLD admitted with chest pain and dyspnea.  He initially presented to [facility] on 12/11 AM with a chief complaint of shortness of breath and chest pain that radiated to his back (between his shoulder blades) which felt like a heart attack.  Troponin I was 0.03 or less. Initial EKG was concerning for a myocardial infarction w/ evidence of diffuse ST elevation prompting activation of a code STEMI and patient was urgently transferred to [facility] for further evaluation and treatment.   Hospital Course     Consultants: none  1. Acute pericarditis/Pericardial effusion/cardiac tamponade: diagnostic cardiac catheterization revealed mild nonobstructive coronary artery disease.  CT angiogram of the chest was performed to rule out aortic dissection, but the patient was found to have a moderate size circumferential pericardial effusion.  Echocardiogram confirmed the presence of at least a moderate sized pericardial effusion with early signs of tamponade. He is now s/p subxyphoid pericardial window on 12/07/18 by Dr. Cornelius Moras. Bloody pericardial fluid removed (450 cc). Drain has been removed.  Dressing intact.  Fluid cultures are negative and cytology is also negative.  No evidence of malignancy. Continue with colchicine for 3 months and ibuprofen for the next 10 days, 800 mg p.o. 3 times daily with meals.  2. HTN: BP improved on increased Coreg to 25 mg  BID and Cozaar 25 mg daily.   3. Diabetes mellitus: continue home regimen  4. Chronic  diastolic CHF: he was resumed on home HCTZ  5. Morbid obesity: Body mass index is 59.64 kg/m.  Continue to encourage weight loss BMI 60.  He is at high risk for further cardiovascular/orthopedic complications in the future.  _____________  Discharge Vitals Blood pressure 116/71, pulse 98, temperature 98 F (36.7 C), temperature source Oral, resp. rate 18, height 5\' 9"  (1.753 m), weight (!) 183.2 kg, SpO2 93 %.  Filed Weights   12/07/18 0519 12/07/18 0730 12/10/18 0300  Weight: (!) 181.4 kg (!) 183.9 kg (!) 183.2 kg    Labs & Radiologic Studies    CBC Recent Labs    12/08/18 0630 12/08/18 2345 12/09/18 0423  WBC 18.9* 18.8* 18.0*  NEUTROABS 16.3*  --   --   HGB 14.8 13.9 14.3  HCT 48.2 46.0 45.3  MCV 88.0 87.6 87.3  PLT 216 225 212   Basic Metabolic Panel Recent Labs    16/10/96 0423 12/10/18 1013  NA 136 137  K 4.2 3.9  CL 98 98  CO2 28 27  GLUCOSE 191* 181*  BUN 16 14  CREATININE 0.81 0.74  CALCIUM 8.3* 8.7*   Liver Function Tests Recent Labs    12/09/18 0423  AST 17  ALT 18  ALKPHOS 41  BILITOT 0.8  PROT 6.6  ALBUMIN 3.1*   No results for input(s): LIPASE, AMYLASE in the last 72 hours. Cardiac Enzymes No results for input(s): CKTOTAL, CKMB, CKMBINDEX, TROPONINI in the last 72 hours. BNP Invalid input(s): POCBNP D-Dimer No results for input(s): DDIMER in the last 72 hours. Hemoglobin A1C No results for input(s): HGBA1C in the last 72 hours. Fasting Lipid Panel No results for input(s): CHOL, HDL, LDLCALC, TRIG, CHOLHDL, LDLDIRECT in the last 72 hours. Thyroid Function Tests No results for input(s): TSH, T4TOTAL, T3FREE, THYROIDAB in the last 72 hours.  Invalid input(s): FREET3 _____________  Dg Chest Port 1 View  Result Date: 12/07/2018 CLINICAL DATA:  Postprocedure EXAM: PORTABLE CHEST 1 VIEW COMPARISON:  CT chest 12/07/2018 FINDINGS: Limited by body habitus. Right-sided central venous catheter tip poorly visible, likely over the SVC. No  gross pneumothorax. Cardiomegaly without edema. IMPRESSION: 1. Limited by habitus. There appears to be right central venous catheter, the tip is poorly visible but suspected to overlie the SVC region. No gross pneumothorax 2. Cardiomegaly.  Low lung volume. Electronically Signed   By: Jasmine Pang M.D.   On: 12/07/2018 19:04   Ct Angio Chest/abd/pel For Dissection W And/or W/wo  Result Date: 12/07/2018 CLINICAL DATA:  Chest and abdominal pain EXAM: CT ANGIOGRAPHY CHEST, ABDOMEN AND PELVIS TECHNIQUE: Initially, axial CT images were obtained through the chest without intravenous contrast material administration. Multidetector CT imaging through the chest, abdomen and pelvis was performed using the standard protocol during bolus administration of intravenous contrast. Multiplanar reconstructed images and MIPs were obtained and reviewed to evaluate the vascular anatomy. CONTRAST:  100 mL ISOVUE-370 IOPAMIDOL (ISOVUE-370) INJECTION 76% COMPARISON:  None. FINDINGS: CTA CHEST FINDINGS Cardiovascular: No intramural hematoma is seen in the thoracic aorta on noncontrast enhanced study. There is no apparent thoracic aortic aneurysm or dissection. The visualized great vessels appear unremarkable. There is no demonstrable pulmonary embolus. Timing of the contrast bolus is less than optimal for visualization of more peripheral pulmonary arterial vessels. There are foci of coronary artery calcification. There is a moderate pericardial effusion. The attenuation of the fluid in the pericardium  is higher than is expected with serous fluid. Mediastinum/Nodes: Thyroid appears unremarkable. There is no appreciable thoracic adenopathy. No esophageal lesions are evident. Lungs/Pleura: There is atelectatic change in both lower lobe regions. There is no frank edema or consolidation. There is no appreciable pleural effusion or pleural thickening. There are a few small scattered granulomas in the lung bases. Musculoskeletal: There are  no blastic or lytic bone lesions. There are foci of degenerative change in the thoracic spine. There are no chest wall lesions evident. Review of the MIP images confirms the above findings. CTA ABDOMEN AND PELVIS FINDINGS VASCULAR Aorta: There is no demonstrable abdominal aortic aneurysm or dissection. There are foci of aortic atherosclerosis. There is no hemodynamically significant obstruction in the abdominal aorta. Celiac: There is no aneurysm or dissection involving the celiac artery or its major branches. These vessels appear patent without appreciable obstructive disease. SMA: Superior mesenteric artery and its major branches appear patent. No aneurysm or dissection involving these vessels evident. Renals: There is a single renal artery on each side. Renal arteries and their branches show no aneurysm or dissection. No evident fibromuscular dysplasia. No appreciable vessel narrowing or obstruction. IMA: Inferior mesenteric artery and its branches appear patent without appreciable obstructive disease. No aneurysm or dissection involving these vessels. Inflow: There is calcification in portions of both common iliac arteries without hemodynamically significant obstruction. There is approximately 50% diameter stenosis in each proximal internal iliac artery with calcification in these vessels. There is patchy calcification in both external iliac arteries without hemodynamically significant obstruction. There is mild atherosclerotic calcification in each common femoral artery without hemodynamically significant obstruction. Similar changes are noted in both proximal superficial femoral and profunda femoral arteries. No aneurysm or dissection involving these vessels evident. Veins: No obvious venous abnormality within the limitations of this arterial phase study. Review of the MIP images confirms the above findings. NON-VASCULAR Hepatobiliary: No focal liver lesions are appreciable. Gallbladder wall is not appreciably  thickened. There is no biliary duct dilatation. Pancreas: No pancreatic mass or inflammatory focus. Spleen: No splenic lesions are evident. Adrenals/Urinary Tract: Adrenals bilaterally appear normal. There is a cyst arising from the medial upper pole left kidney measuring 2.0 x 2.0 cm. No hydronephrosis is evident on either side. There is contrast in the collecting systems bilaterally which potentially could mask small calculi; no obvious calculi are appreciable in the kidneys. There is no evident ureteral calculus on either side. Urinary bladder is midline with wall thickness within normal limits. Stomach/Bowel: There is no appreciable bowel wall or mesenteric thickening. No evident bowel obstruction. No free air or portal venous air. Lymphatic: No adenopathy is evident in the abdomen or pelvis. Reproductive: Prostate and seminal vesicles appear normal in size and contour. No pelvic mass evident. Other: There is a midline ventral hernia which contains fat but no bowel. The appendix region appears unremarkable without inflammation. There is no abscess or ascites in the abdomen or pelvis. Musculoskeletal: No evident blastic or lytic bone lesions. There is calcification along the periphery of the left gluteus maximus muscle, potentially residua of old trauma. Intramuscular regions otherwise appear normal. Review of the MIP images confirms the above findings. IMPRESSION: CT angiogram chest: 1. There is a moderate pericardial effusion. The attenuation of the fluid in the pericardium is higher than is expected with serous fluid. Exudative fluid secondary to infection or possible neoplasm is a differential consideration. Hemorrhage within the pericardium is a differential consideration as well. No contrast extravasation is seen extending into the pericardium.  This finding may warrant echocardiography to further assess. 2. No thoracic aortic aneurysm or dissection evident. No pulmonary embolus seen with proviso that the  timing bolus of the contrast is less than optimal to visualized more peripheral pulmonary arterial vessels. 3. Bibasilar atelectasis. No frank edema or consolidation. Occasional small calcified granulomas in the bases. 4.  There are foci of coronary artery calcification. 5.  No evident thoracic adenopathy. CT angiogram abdomen; CT angiogram pelvis: 1. No aneurysm or dissection involving the aorta or major mesenteric/pelvic arterial vessels. There are foci of atherosclerosis in the aorta as well as in multiple pelvic arterial vessels without hemodynamically significant obstruction in these areas. 2.  Ventral hernia containing fat but no bowel. 3. No hydronephrosis. No ureteral calculi. No renal calculi evident with the proviso that contrast in the collecting systems could obscure calculi. 4. No evident bowel obstruction. No abscess in the abdomen or pelvis. Appendix region appears normal. Aortic Atherosclerosis (ICD10-I70.0). These results were called by telephone at the time of interpretation on 12/07/2018 at 8:49 am to [facility], who verbally acknowledged these results. Electronically Signed   By: Bretta Bang III M.D.   On: 12/07/2018 08:50   Disposition   Pt is being discharged home today in good condition.  Follow-up Plans & Appointments    Follow-up Information    Purcell Nails, MD Follow up.   Specialty:  Cardiothoracic Surgery Why:  The office will contact you with appointments for suture removal as well as to see the surgeon. Contact information: [address] [place] [address] [phone]        Kathleene Hazel, MD Follow up.   Specialty:  Cardiology Why:  The office will call you to arrange follow up with Dr. Clifton James or one of his PAs/NPs in 1-2 weeks Contact information: [address] [place] [address] [phone]            Discharge Medications   Allergies as of 12/10/2018      Reactions   Amoxicillin  Hives   Metformin And Related    sick   Simvastatin-high Dose    Elevated LFTs   Canagliflozin Diarrhea   (invokana)   Exenatide Nausea Only   (byetta)   Gemfibrozil Other (See Comments)      Medication List    TAKE these medications   ALPRAZolam 0.5 MG tablet Commonly known as:  XANAX Take 0.5 mg by mouth at bedtime as needed for sleep.   carisoprodol 350 MG tablet Commonly known as:  SOMA Take 350 mg by mouth 2 (two) times daily as needed for muscle spasms.   carvedilol 25 MG tablet Commonly known as:  COREG Take 25 mg by mouth 2 (two) times daily.   colchicine 0.6 MG tablet Take 1 tablet (0.6 mg total) by mouth 2 (two) times daily.   HUMULIN N 100 UNIT/ML injection Generic drug:  insulin NPH Human Inject 40 Units into the skin 2 (two) times daily with a meal.   HUMULIN R 100 units/mL injection Generic drug:  insulin regular Inject 70 Units into the skin 3 (three) times daily with meals.   hydrochlorothiazide 25 MG tablet Commonly known as:  HYDRODIURIL Take 25 mg by mouth daily.   ibuprofen 800 MG tablet Commonly known as:  ADVIL,MOTRIN Take 1 tablet (800 mg total) by mouth 3 (three) times daily with meals for 10 days.   losartan 25 MG tablet Commonly known as:  COZAAR Take 25  mg by mouth daily.   multivitamin with minerals tablet Take 1 tablet by mouth daily.   omeprazole 20 MG capsule Commonly known as:  PRILOSEC Take 20 mg by mouth every other day.   pioglitazone 30 MG tablet Commonly known as:  ACTOS Take 30 mg by mouth daily.   rosuvastatin 10 MG tablet Commonly known as:  CRESTOR Take 10 mg by mouth every evening.   testosterone cypionate 200 MG/ML injection Commonly known as:  DEPOTESTOSTERONE CYPIONATE Inject 1 mL into the muscle every 14 (fourteen) days.   VENTOLIN HFA 108 (90 Base) MCG/ACT inhaler Generic drug:  albuterol Inhale 2 puffs into the lungs every 6 (six) hours as needed for wheezing.         Outstanding Labs/Studies     none  Duration of Discharge Encounter   Greater than 30 minutes including physician time.  Signed, Cline CrockKathryn Thompson, PA-C 12/10/2018, 12:39 PM [phone]  Personally seen and examined. Agree with above.  Primary Cardiologist: Peter [name], MD   Subjective   He states he is ready to go home.  No significant chest pain or shortness of breath.  Inpatient Medications    Scheduled Meds: . albuterol  2.5 mg Nebulization Q4H WA  . bisacodyl  10 mg Oral Daily  . carvedilol  25 mg Oral BID WC  . colchicine  0.6 mg Oral BID  . hydrochlorothiazide  25 mg Oral Daily  . ibuprofen  800 mg Oral TID WC  . insulin aspart  0-24 Units Subcutaneous Q4H  . insulin aspart  6 Units Subcutaneous TID WC  . insulin detemir  20 Units Subcutaneous BID  . losartan  25 mg Oral Daily  . pantoprazole  40 mg Oral Daily  . senna-docusate  1 tablet Oral QHS   Continuous Infusions: . potassium chloride     PRN Meds: carisoprodol, morphine injection, ondansetron (ZOFRAN) IV, oxyCODONE, potassium chloride, traMADol   Vital Signs          Vitals:   12/10/18 0400 12/10/18 0751 12/10/18 0835 12/10/18 0843  BP: 106/64  (!) 141/86   Pulse: 91  99 95  Resp: 16  17 17   Temp: 98.3 F (36.8 C)  98 F (36.7 C)   TempSrc: Oral  Oral   SpO2: 96% 97% 98% 96%  Weight:      Height:        Intake/Output Summary (Last 24 hours) at 12/10/2018 1132 Last data filed at 12/10/2018 0700    Gross per 24 hour  Intake 720 ml  Output 0 ml  Net 720 ml        Filed Weights   12/07/18 0519 12/07/18 0730 12/10/18 0300  Weight: (!) 181.4 kg (!) 183.9 kg (!) 183.2 kg    Telemetry    Sinus rhythm in the upper 90s- Personally Reviewed  ECG    No new- Personally Reviewed  Physical Exam   GEN:No acute distress.  Morbidly obese Neck:No JVD right IJ in place Cardiac:RRR, no murmurs, rubs, or gallops.  Dressing with suction intact Respiratory:Clear to auscultation  bilaterally. UJ:WJXBGI:Soft, nontender, non-distended  MS: Chronic appearing lower extremity 2+ edema with pigmentation; No deformity. Neuro:Nonfocal  Psych: Normal affect   Labs    Chemistry LastLabs         Recent Labs  Lab 12/07/18 0526  12/08/18 0630 12/09/18 0423 12/10/18 1013  NA 140   < > 136 136 137  K 3.7   < > 4.3 4.2 3.9  CL 96*   < >  95* 98 98  CO2 32  --  28 28 27   GLUCOSE 104*   < > 252* 191* 181*  BUN 16   < > 19 16 14   CREATININE 0.82   < > 1.04 0.81 0.74  CALCIUM 9.4  --  8.7* 8.3* 8.7*  PROT 7.7  --   --  6.6  --   ALBUMIN 4.4  --   --  3.1*  --   AST 25  --   --  17  --   ALT 26  --   --  18  --   ALKPHOS 49  --   --  41  --   BILITOT 0.7  --   --  0.8  --   GFRNONAA >60  --  >60 >60 >60  GFRAA >60  --  >60 >60 >60  ANIONGAP 12  --  13 10 12    < > = values in this interval not displayed.       Hematology LastLabs       Recent Labs  Lab 12/08/18 0630 12/08/18 2345 12/09/18 0423  WBC 18.9* 18.8* 18.0*  RBC 5.48 5.25 5.19  HGB 14.8 13.9 14.3  HCT 48.2 46.0 45.3  MCV 88.0 87.6 87.3  MCH 27.0 26.5 27.6  MCHC 30.7 30.2 31.6  RDW 14.7 14.7 14.8  PLT 216 225 212      Cardiac Enzymes LastLabs       Recent Labs  Lab 12/07/18 0526 12/07/18 0731 12/07/18 1140  TROPONINI <0.03 <0.03 <0.03      LastLabs     Recent Labs  Lab 12/07/18 0553  TROPIPOC 0.00       BNP LastLabs     Recent Labs  Lab 12/07/18 0731  BNP 11.9       DDimer  LastLabs  No results for input(s): DDIMER in the last 168 hours.     Radiology    ImagingResults(Last48hours)  No results found.    Cardiac Studies   Pericardial fluid was bloody however there was no evidence of malignancy.  Cardiac cath showed mild nonobstructive CAD with calcification normal LV function and elevated LVEDP  Echocardiogram on 12/07/2018 demonstrated findings concerning for elevated intrapericardial pressure, moderate to severe pericardial  effusion.  Pericarditis    Patient Profile     52 y.o. male with acute pericarditis status post subxiphoid window, pericardial drain, bloody effusion, no malignancy, morbid obesity with diabetes with hypertension hyperlipidemia, mild nonobstructive CAD  Assessment & Plan    Acute pericarditis, pericardial effusion, cardiac tamponade - Subxiphoid pericardial window on 12/07/2018 with 400 cc of bloody pericardial fluid removed.  Drain has been removed.  Dressing intact.  Fluid cultures are negative and cytology is also negative.  No evidence of malignancy. - Continue with colchicine for 3 months and ibuprofen for the next 10 days, 800 mg p.o. 3 times daily with meals.  -Cardiac catheterization reassuring with only mild nonobstructive disease.  Diabetes with morbid obesity hypertension and chronic diastolic heart failure - He is back on HCTZ.  Carvedilol and Cozaar back. -Continue to encourage weight loss BMI 60.  He is at high risk for further cardiovascular/orthopedic complications in the future.  We will discontinue his right IJ.  Okay for discharge.  For questions or updates, please contact [facility] Please consult [email or web address] for contact info under        Signed, Donato Schultz, MD

## 2018-12-12 ENCOUNTER — Telehealth: Payer: Self-pay | Admitting: Physician Assistant

## 2018-12-12 ENCOUNTER — Telehealth: Payer: Self-pay

## 2018-12-12 ENCOUNTER — Telehealth: Payer: Self-pay | Admitting: Cardiovascular Disease

## 2018-12-12 LAB — ANAEROBIC CULTURE

## 2018-12-12 MED ORDER — COLCHICINE 0.6 MG PO CAPS: 1.0000 | ORAL_CAPSULE | Freq: Two times a day (BID) | ORAL | 0 refills | Status: DC

## 2018-12-12 NOTE — Telephone Encounter (Signed)
New Message         Patient's wife is calling today to get a medication change. The Colchicine that was prescribe  is $1600 this is not affordable for the patient. Pls call and advise.

## 2018-12-12 NOTE — Telephone Encounter (Signed)
Spoke to patient's wife.Pharmacist Raquel advised insurance will pay for colchicine capsules and not the tablets.She sent prescription in for the capsules.She also wanted to know about the suture removal.Advised to call Dr.Owen's office tomorrow for suture removal and appointment to see Dr.Owen.Advised to keep appointment with Lucile Craterana Dunn PA 12/20/18 at 11:30 am.

## 2018-12-12 NOTE — Telephone Encounter (Signed)
Wow. Very expensive. I guess we just try ibuprofen alone. Will route to Dr. Anne FuSkains who discharged him with me and see if he has any other suggestions.

## 2018-12-12 NOTE — Telephone Encounter (Signed)
New Message   Patient has a TOC appt with Ronie Spiesayna Dunn 12/24 at 11:30am.

## 2018-12-12 NOTE — Telephone Encounter (Signed)
Follow up    Patient wife is following up in reference to the patients medication. As well as she wants to know whether or not the stitches are dissolvable. Please call to discuss.   Pt c/o medication issue:  1. Name of Medication: colchicine 0.6 MG tablet    2. How are you currently taking this medication (dosage and times per day)?   3. Are you having a reaction (difficulty breathing--STAT)?   4. What is your medication issue? Medication is to expensive .

## 2018-12-12 NOTE — Telephone Encounter (Signed)
Insurance will Not pay for colchicine tables but WILL pay for capsules. New Rx sent to pharmacy with capsules.   Please call patient

## 2018-12-12 NOTE — Telephone Encounter (Signed)
Primary cardiologist is Dr. [name]

## 2018-12-12 NOTE — Telephone Encounter (Signed)
Patient's family contacted in regards to message left from over the weekend about medication at discharge.  Per patient, he was given brand prescription for colchicine at discharge from the hospital and insurance was not going to pay for it.  Patient advised to contact his Cardiologist in regards to medication changes/ refills.  He acknowledged receipt.

## 2018-12-12 NOTE — Telephone Encounter (Signed)
Disregard opened in error °

## 2018-12-12 NOTE — Telephone Encounter (Signed)
Returned call to patient's wife.She stated colchicine too expensive.Advised I will send message to our pharmacist for advice.

## 2018-12-13 LAB — CHOLESTEROL, BODY FLUID

## 2018-12-13 NOTE — Telephone Encounter (Signed)
Patient contacted regarding discharge from [place] hospital on 12/10/18.  Patient understands to follow up with provider Ronie Spiesayna Dunn on 12/20/18 at 11:30 am at [address]. Patient understands discharge instructions? yes Patient understands medications and regiment? yes Patient understands to bring all medications to this visit? yes

## 2018-12-14 LAB — QUANTIFERON-TB GOLD PLUS (RQFGPL)
QuantiFERON Mitogen Value: 0.25 IU/mL
QuantiFERON Nil Value: 0.04 IU/mL
QuantiFERON TB1 Ag Value: 0.04 IU/mL
QuantiFERON TB2 Ag Value: 0.05 IU/mL

## 2018-12-14 LAB — QUANTIFERON-TB GOLD PLUS: QuantiFERON-TB Gold Plus: UNDETERMINED

## 2018-12-15 LAB — TOTAL BILIRUBIN, BODY FLUID

## 2018-12-19 ENCOUNTER — Other Ambulatory Visit: Payer: Self-pay | Admitting: Thoracic Surgery (Cardiothoracic Vascular Surgery)

## 2018-12-19 ENCOUNTER — Other Ambulatory Visit: Payer: Self-pay

## 2018-12-19 ENCOUNTER — Ambulatory Visit (INDEPENDENT_AMBULATORY_CARE_PROVIDER_SITE_OTHER): Payer: Self-pay | Admitting: Physician Assistant

## 2018-12-19 ENCOUNTER — Encounter: Payer: Self-pay | Admitting: Physician Assistant

## 2018-12-19 DIAGNOSIS — Z9889 Other specified postprocedural states: Secondary | ICD-10-CM

## 2018-12-19 DIAGNOSIS — I313 Pericardial effusion (noninflammatory): Secondary | ICD-10-CM

## 2018-12-19 DIAGNOSIS — I314 Cardiac tamponade: Principal | ICD-10-CM

## 2018-12-19 DIAGNOSIS — Z4802 Encounter for removal of sutures: Secondary | ICD-10-CM

## 2018-12-19 NOTE — Progress Notes (Signed)
Cardiology Office Note    Date:  12/20/2018  ID:  Cody Erickson, DOB 05/25/1966, MRN [id number] PCP:  Cody CanterburyBoals, Aaron, MD  Cardiologist:  Cody [name], MD   Chief Complaint: f/u pericardial effusion  History of Present Illness:  Cody Erickson is a 52 y.o. male with history of DM, HTN, HLD, super morbid obesity, OSA, chronic edema and venous stasis, recent pericarditis/pericardial effusion who presents for post-hospital follow-up. For several months he's noticed worsening edema and dyspnea on exertion. He initially attributed this to his weight. The early morning of 12/07/18 he presented Cody Erickson with acute chest pain and concern for STEMI Initial labs showed negative troponin but did indicate leukocytosis with WBC 17.2. He was taken emergently to the cath lab which only showed mild nonobstructive disease. CT was obtained which did exclude aortic dissection, but did show at least a moderate pericardial effusion with heterogenous appearance. 2D echo showed moderate LVH, EF 65-70%, moderate pericardial effusion with increased intrapericardial fressure. There was clinical concern for cardiac tamponade. Given his morbid obesity, pericardiocentesis was not ideal due to risks. He instead underwent subxiphoid pericardial window later that day, which was bloody (450cc removed). Intra-op TEE confirmed a normal-appearing thoracic aorta with no sign of aortic dissection. Last labs 12/10/18 showed K 3.9, Cr 0.74, WBC 18k, Hgb 14.4, AST/ALT OK. Culture was negative for infection and acid fast preliminary smear was negative. Quantiferon was indeterminate. Cytology without malignant cells. He had no specific inciting source. He has followed up for his incision site with CVTS that required more packing. He is on colchicine and ibuprofen.  He returns for follow-up reporting he feels much better since recent admission but this has been slow progress day by day. He's slowly getting energy back. His pain is better  controlled, still flares from time to time. He had to reduce ibuprofen to 200mg  TID because it upset his stomach and caused increased GI urgency. His edema is much improved. His weight is down almost 20lb. He has begun wearing his CPAP nightly and has noticed his BP is now better controlled. He had to reduce his carvedilol to 1/2 tablet BID to maintain a normal BP. His baseline pulse even in prior years appears 95-116. He does report on 2 occasions overnight he woke up with his CPAP off and noted rapid irregular heart rate. He sat on the side of the bed and this resolved after several minutes. Otherwise he feels good today.   Past Medical History:  Diagnosis Date  . Chronic diastolic CHF (congestive heart failure) (HCC)   . Diabetes mellitus without complication (HCC)   . Hyperlipidemia   . Hypertension   . Morbid obesity (HCC)   . OSA (obstructive sleep apnea)   . Pericardial effusion 12/07/2018   a. s/p subxiphoid window 11/2018.    Past Surgical History:  Procedure Laterality Date  . HERNIA REPAIR    . LEFT HEART CATH AND CORONARY ANGIOGRAPHY N/A 12/07/2018   Procedure: LEFT HEART CATH AND CORONARY ANGIOGRAPHY;  Surgeon: Erickson, Cody M, MD;  [facility];  Service: Cardiovascular;  Laterality: N/A;  . SUBXYPHOID PERICARDIAL WINDOW N/A 12/07/2018   Procedure: SUBXYPHOID PERICARDIAL WINDOW;  Surgeon: Cody Erickson, Cody H, MD;  [facility];  Service: Thoracic;  Laterality: N/A;  . TEE WITHOUT CARDIOVERSION N/A 12/07/2018   Procedure: TRANSESOPHAGEAL ECHOCARDIOGRAM (TEE);  Surgeon: Cody Erickson, Cody H, MD;  [facility];  Service: Thoracic;  Laterality: N/A;    Current Medications: Current Meds  Medication Sig  .  ALPRAZolam (XANAX) 0.5 MG tablet Take 0.5 mg by mouth at bedtime as needed for sleep.  . carisoprodol (SOMA) 350 MG tablet Take 350 mg by mouth 2 (two) times daily as needed for muscle spasms.  . carvedilol (COREG) 25 MG tablet Take 12.5 mg by mouth 2 (two)  times daily.   . Colchicine (MITIGARE) 0.6 MG CAPS Take 1 capsule by mouth 2 (two) times daily.  Marland Kitchen. HUMULIN N 100 UNIT/ML injection Inject 20 Units into the skin 2 (two) times daily with a meal.   . HUMULIN R 100 UNIT/ML injection Inject 35 Units into the skin 3 (three) times daily with meals.   . hydrochlorothiazide (HYDRODIURIL) 25 MG tablet Take 25 mg by mouth daily.  Marland Kitchen. ibuprofen (ADVIL,MOTRIN) 200 MG tablet Take 200 mg by mouth 3 (three) times daily. For 10 days  . losartan (COZAAR) 25 MG tablet Take 25 mg by mouth daily.  . Multiple Vitamins-Minerals (MULTIVITAMIN WITH MINERALS) tablet Take 1 tablet by mouth daily.  Marland Kitchen. omeprazole (PRILOSEC) 20 MG capsule Take 20 mg by mouth every other day.  . pioglitazone (ACTOS) 30 MG tablet Take 30 mg by mouth daily.  . rosuvastatin (CRESTOR) 10 MG tablet Take 10 mg by mouth every evening.  . testosterone cypionate (DEPOTESTOSTERONE CYPIONATE) 200 MG/ML injection Inject 1 mL into the muscle every 14 (fourteen) days.  . VENTOLIN HFA 108 (90 Base) MCG/ACT inhaler Inhale 2 puffs into the lungs every 6 (six) hours as needed for wheezing.   Allergies:   Amoxicillin; Metformin and related; Simvastatin-high dose; Canagliflozin; Exenatide; and Gemfibrozil   Social History   Socioeconomic History  . Marital status: Married    Spouse name: Not on file  . Number of children: Not on file  . Years of education: Not on file  . Highest education level: Not on file  Occupational History  . Not on file  Social Needs  . Financial resource strain: Not on file  . Food insecurity:    Worry: Not on file    Inability: Not on file  . Transportation needs:    Medical: Not on file    Non-medical: Not on file  Tobacco Use  . Smoking status: Current Some Day Smoker  . Smokeless tobacco: Never Used  Substance and Sexual Activity  . Alcohol use: Not Currently    Frequency: Never  . Drug use: Never  . Sexual activity: Not on file  Lifestyle  . Physical activity:     Days per week: Not on file    Minutes per session: Not on file  . Stress: Not on file  Relationships  . Social connections:    Talks on phone: Not on file    Gets together: Not on file    Attends religious service: Not on file    Active member of club or organization: Not on file    Attends meetings of clubs or organizations: Not on file    Relationship status: Not on file  Other Topics Concern  . Not on file  Social History Narrative  . Not on file     Family History:  The patient's family history includes Aortic dissection in his father.  ROS:   Please see the history of present illness.  All other systems are reviewed and otherwise negative.    PHYSICAL EXAM:   VS:  BP 110/68   Pulse 100   Ht 5\' 9"  (1.753 m)   Wt (!) 385 lb 9.6 oz (174.9 kg)   SpO2  94%   BMI 56.94 kg/m   BMI: Body mass index is 56.94 kg/m. GEN: Well nourished, well developed morbidly obese WM, in no acute distress HEENT: normocephalic, atraumatic Neck: no JVD, carotid bruits, or masses Cardiac: RRR, borderline elevated, no murmurs, rubs, or gallops, no edema  Respiratory:  clear to auscultation bilaterally, normal work of breathing GI: soft, nontender, nondistended, + BS MS: no deformity or atrophy Skin: warm and dry, no rash Neuro:  Alert and Oriented x 3, Strength and sensation are intact, follows commands Psych: euthymic mood, full affect  Wt Readings from Last 3 Encounters:  12/20/18 (!) 385 lb 9.6 oz (174.9 kg)  12/10/18 (!) 403 lb 14.1 oz (183.2 kg)      Studies/Labs Reviewed:   EKG:  EKG was ordered today and personally reviewed by me and demonstrates NSR 99bpm nonspecific ST-T changes, ST elevation much improved  Recent Labs: 12/07/2018: B Natriuretic Peptide 11.9; TSH 2.280 12/09/2018: ALT 18; Hemoglobin 14.3; Platelets 212 12/10/2018: BUN 14; Creatinine, Ser 0.74; Potassium 3.9; Sodium 137   Lipid Panel    Component Value Date/Time   CHOL 145 12/07/2018 0526   TRIG 120  12/07/2018 0526   HDL 42 12/07/2018 0526   CHOLHDL 3.5 12/07/2018 0526   VLDL 24 12/07/2018 0526   LDLCALC 79 12/07/2018 0526    Additional studies/ records that were reviewed today include: Summarized above  ASSESSMENT & PLAN:   1. Pericarditis/pericardial effusion - clinically improving. He still has some occasional discomfort but some of this may be post-op as well. He is feeling better day by day. EKG appears to be improving. He was unable to tolerate higher dose ibuprofen due to GI upset but seems to be doing well on the 200mg  dose TID. He did increase his Prilosec to daily instead of every other day; I recommended he continue that. I did tell him over the next few weeks as he feels better he can further reduce his ibuprofen by a taper method. He is to call if he develops any recurrent surge in pain. Continue colchicine x 3 months total. I also asked him to f/u with PCP given indeterminate quantiferon level. His denies any fever or night sweats and had no evidence of active TB on CT. AFB smear was preliminarily negative. 2. Palpitations - given his weight and OSA he is at higher risk for this representing AF. Will arrange 30 day monitor. 3. LEE - question chronic diastolic CHF versus lymphedema from morbid obesity. Suspect moreso the latter. Volume status improved. Continue current regimen.  4. Essential HTN - BP improved significantly after pericardial window to the low 100s in the hospital. At home once he began CPAP he noticed this continued trend. He self-reduced carvedilol due to softer BP and feels much better. Continue lower dose. 5. Hyperlipidemia - LDL 79. Mild CAD by cath. Would not escalate statin at this time due to concomitant colchicine. 6. Morbid obesity - discussed implications with patient. Erickson term this will complicate his medical management tremendously. He is agreeable to referral to [facility].  Disposition: F/u with Dr. Blenda Mounts team APP in 2  weeks to ensure pain continues to resolve after taper.   Medication Adjustments/Labs and Tests Ordered: Current medicines are reviewed at length with the patient today.  Concerns regarding medicines are outlined above. Medication changes, Labs and Tests ordered today are summarized above and listed in the Patient Instructions accessible in Encounters.   Signed, Laurann Montana, PA-C  12/20/2018 11:44 AM  [facility] [place], [place], [place]  [address] Phone: [phone]; Fax: [phone]

## 2018-12-19 NOTE — Progress Notes (Signed)
  HPI: Patient returns for chest tube suture removal. He had a a subxiphoid pericardial window on 12/07/2018 by Dr. Cornelius Moraswen. Patient has had minor sero sanguinous drainage from lower subxiphoid incision. . Nurse informed me that after chest tube suture removal, wound was opened and "tunnels deep". He denies fever or chills   Current Outpatient Medications  Medication Sig Dispense Refill  . ALPRAZolam (XANAX) 0.5 MG tablet Take 0.5 mg by mouth at bedtime as needed for sleep.  0  . carisoprodol (SOMA) 350 MG tablet Take 350 mg by mouth 2 (two) times daily as needed for muscle spasms.    . carvedilol (COREG) 25 MG tablet Take 25 mg by mouth 2 (two) times daily.  4  . Colchicine (MITIGARE) 0.6 MG CAPS Take 1 capsule by mouth 2 (two) times daily. 180 capsule 0  . HUMULIN N 100 UNIT/ML injection Inject 40 Units into the skin 2 (two) times daily with a meal.  11  . HUMULIN R 100 UNIT/ML injection Inject 70 Units into the skin 3 (three) times daily with meals.  11  . hydrochlorothiazide (HYDRODIURIL) 25 MG tablet Take 25 mg by mouth daily.    Marland Kitchen. ibuprofen (ADVIL,MOTRIN) 800 MG tablet Take 1 tablet (800 mg total) by mouth 3 (three) times daily with meals for 10 days. 30 tablet 0  . losartan (COZAAR) 25 MG tablet Take 25 mg by mouth daily.  6  . Multiple Vitamins-Minerals (MULTIVITAMIN WITH MINERALS) tablet Take 1 tablet by mouth daily.    Marland Kitchen. omeprazole (PRILOSEC) 20 MG capsule Take 20 mg by mouth every other day.    . pioglitazone (ACTOS) 30 MG tablet Take 30 mg by mouth daily.  11  . rosuvastatin (CRESTOR) 10 MG tablet Take 10 mg by mouth every evening.  6  . testosterone cypionate (DEPOTESTOSTERONE CYPIONATE) 200 MG/ML injection Inject 1 mL into the muscle every 14 (fourteen) days.  2  . VENTOLIN HFA 108 (90 Base) MCG/ACT inhaler Inhale 2 puffs into the lungs every 6 (six) hours as needed for wheezing.  0   Physical Exam: Wounds-Subxiphoid incision has minor sero sanguinous drainage from lower sternal  wound. There are multiple eschars and no sign of infection. Chest tube wound has clot in it and it does tunnel (as where chest tube once was). Again, no sign of infection.    Impression and Plan: I packed chest tube wound with iodoform gauze and placed dry 4x4s with tape.  Patient's significant other will was instructed to to this daily. Patient instructed that it will likely take several weeks for the wound to heal from the inside out and that packing should help with healing time. Patient states he has been applying dry gauze and tape to lower sternal wound and changing it everyday and was encouraged to continuing doing so. This is likely from fat necrosis. Patient has follow up appointment on Monday 12/30. He was instructed to contact the office sooner if drainage worsens, he develops fever or chills.     Ardelle Ballsonielle M Zimmerman, PA-C [facility] [phone]

## 2018-12-20 ENCOUNTER — Telehealth: Payer: Self-pay | Admitting: Physician Assistant

## 2018-12-20 ENCOUNTER — Encounter: Payer: Self-pay | Admitting: Physician Assistant

## 2018-12-20 ENCOUNTER — Ambulatory Visit (INDEPENDENT_AMBULATORY_CARE_PROVIDER_SITE_OTHER): Payer: 59 | Admitting: Physician Assistant

## 2018-12-20 VITALS — BP 110/68 | HR 100 | Ht 69.0 in | Wt 385.6 lb

## 2018-12-20 DIAGNOSIS — E785 Hyperlipidemia, unspecified: Secondary | ICD-10-CM

## 2018-12-20 DIAGNOSIS — R609 Edema, unspecified: Secondary | ICD-10-CM

## 2018-12-20 DIAGNOSIS — I10 Essential (primary) hypertension: Secondary | ICD-10-CM

## 2018-12-20 DIAGNOSIS — I313 Pericardial effusion (noninflammatory): Secondary | ICD-10-CM | POA: Diagnosis not present

## 2018-12-20 DIAGNOSIS — R002 Palpitations: Secondary | ICD-10-CM | POA: Diagnosis not present

## 2018-12-20 DIAGNOSIS — I314 Cardiac tamponade: Secondary | ICD-10-CM

## 2018-12-20 MED ORDER — CARVEDILOL 25 MG PO TABS: 12.5000 mg | ORAL_TABLET | Freq: Two times a day (BID) | ORAL | 3 refills | Status: DC

## 2018-12-20 MED ORDER — CARVEDILOL 12.5 MG PO TABS: 12.5000 mg | ORAL_TABLET | Freq: Two times a day (BID) | ORAL | 6 refills | Status: DC

## 2018-12-20 NOTE — Patient Instructions (Addendum)
Medication Instructions:  Your physician has recommended you make the following change in your medication:  1.  REDUCE the Carvedilol to 12.5 mg taking 1 tablet twice a day  Gradually taper down the Ibuprofen as you feel better.  If you need a refill on your cardiac medications before your next appointment, please call your pharmacy.   Lab work: None ordered  If you have labs (blood work) drawn today and your tests are completely normal, you will receive your results only by: Marland Kitchen. MyChart Message (if you have MyChart) OR . A paper copy in the mail If you have any lab test that is abnormal or we need to change your treatment, we will call you to review the results.  Testing/Procedures: Your physician has requested that you have an Limited echocardiogram. Echocardiography is a painless test that uses sound waves to create images of your heart. It provides your doctor with information about the size and shape of your heart and how well your heart's chambers and valves are working. This procedure takes approximately one hour. There are no restrictions for this procedure.  Your physician has recommended that you wear an event monitor. Event monitors are medical devices that record the heart's electrical activity. Doctors most often us these monitors to diagnose arrhythmias. Arrhythmias are problems with the speed or rhythm of the heartbeat. The monitor is a small, portable device. You can wear one while you do your normal daily activities. This is usually used to diagnose what is causing palpitations/syncope (passing out).  You have been referred to [facility].  They will contact you for an appt.   Follow-Up: Your physician recommends that you schedule a follow-up appointment in: 2 WEEKS WITH DR. [name] OR HIS CARE TEAM APP  Any Other Special Instructions Will Be Listed Below (If Applicable).   Ambulatory Cardiac Monitoring An ambulatory cardiac monitor is a small recording  device that is used to detect abnormal heart rhythms (arrhythmias). Most monitors are connected by wires to flat, sticky disks (electrodes) that are then attached to your chest. You may need to wear a monitor if you have had symptoms such as:  Fast heartbeats (palpitations).  Dizziness.  Fainting or light-headedness.  Unexplained weakness.  Shortness of breath. There are several types of monitors. Some common monitors include:  Holter monitor. This records your heart rhythm continuously, usually for 24-48 hours.  Event (episodic) monitor. This monitor has a symptoms button, and when pushed, it will begin recording. You need to activate this monitor to record when you have a heart-related symptom.  Automatic detection monitor. This monitor will begin recording when it detects an abnormal heartbeat. What are the risks? Generally, these devices are safe to use. However, it is possible that the skin under the electrodes will become irritated. How to prepare for monitoring Your health care provider will prepare your chest for the electrode placement and show you how to use the monitor.  Do not apply lotions to your chest before monitoring.  Follow directions on how to care for the monitor, and how to return the monitor when the testing period is complete. How to use your cardiac monitor  Follow directions about how long to wear the monitor, and if you can take the monitor off in order to shower or bathe. ? Do not let the monitor get wet. ? Do not bathe, swim, or use a hot tub while wearing the monitor.  Keep your skin clean. Do not put body lotion or moisturizer on  your chest.  Change the electrodes as told by your health care provider, or any time they stop sticking to your skin. You may need to use medical tape to keep them on.  Try to put the electrodes in slightly different places on your chest to help prevent skin irritation. Follow directions from your health care provider about  where to place the electrodes.  Make sure the monitor is safely clipped to your clothing or in a location close to your body as recommended by your health care provider.  If your monitor has a symptoms button, press the button to mark an event as soon as you feel a heart-related symptom, such as: ? Dizziness. ? Weakness. ? Light-headedness. ? Palpitations. ? Thumping or pounding in your chest. ? Shortness of breath. ? Unexplained weakness.  Keep a diary of your activities, such as walking, doing chores, and taking medicine. It is very important to note what you were doing when you pushed the button to record your symptoms. This will help your health care provider determine what might be contributing to your symptoms.  Send the recorded information as recommended by your health care provider. It may take some time for your health care provider to process the results.  Change the batteries as told by your health care provider.  Keep electronic devices away from your monitor. These include: ? Tablets. ? MP3 players. ? Cell phones.  While wearing your monitor you should avoid: ? Electric blankets. ? Firefighterlectric razors. ? Electric toothbrushes. ? Microwave ovens. ? Magnets. ? Metal detectors. Get help right away if:  You have chest pain.  You have shortness of breath or extreme difficulty breathing.  You develop a very fast heartbeat that does not get better.  You develop dizziness that does not go away.  You faint or constantly feel like you are about to faint. Summary  An ambulatory cardiac monitor is a small recording device that is used to detect abnormal heart rhythms (arrhythmias).  Make sure you understand how to send the information from the monitor to your health care provider.  It is important to press the button on the monitor when you have any heart-related symptoms.  Keep a diary of your activities, such as walking, doing chores, and taking medicine. It is very  important to note what you were doing when you pushed the button to record your symptoms. This will help your health care provider learn what might be causing your symptoms. This information is not intended to replace advice given to you by your health care provider. Make sure you discuss any questions you have with your health care provider. Document Released: 09/22/2008 Document Revised: 09/29/2017 Document Reviewed: 11/28/2016 Elsevier Interactive Patient Education  2019 [employer]  Echocardiogram An echocardiogram is a procedure that uses painless sound waves (ultrasound) to produce an image of the heart. Images from an echocardiogram can provide important information about:  Signs of coronary artery disease (CAD).  Aneurysm detection. An aneurysm is a weak or damaged part of an artery wall that bulges out from the normal force of blood pumping through the body.  Heart size and shape. Changes in the size or shape of the heart can be associated with certain conditions, including heart failure, aneurysm, and CAD.  Heart muscle function.  Heart valve function.  Signs of a past heart attack.  Fluid buildup around the heart.  Thickening of the heart muscle.  A tumor or infectious growth around the heart valves. Tell a health  care provider about:  Any allergies you have.  All medicines you are taking, including vitamins, herbs, eye drops, creams, and over-the-counter medicines.  Any blood disorders you have.  Any surgeries you have had.  Any medical conditions you have.  Whether you are pregnant or may be pregnant. What are the risks? Generally, this is a safe procedure. However, problems may occur, including:  Allergic reaction to dye (contrast) that may be used during the procedure. What happens before the procedure? No specific preparation is needed. You may eat and drink normally. What happens during the procedure?   An IV tube may be inserted into one of your  veins.  You may receive contrast through this tube. A contrast is an injection that improves the quality of the pictures from your heart.  A gel will be applied to your chest.  A wand-like tool (transducer) will be moved over your chest. The gel will help to transmit the sound waves from the transducer.  The sound waves will harmlessly bounce off of your heart to allow the heart images to be captured in real-time motion. The images will be recorded on a computer. The procedure may vary among health care providers and hospitals. What happens after the procedure?  You may return to your normal, everyday life, including diet, activities, and medicines, unless your health care provider tells you not to do that. Summary  An echocardiogram is a procedure that uses painless sound waves (ultrasound) to produce an image of the heart.  Images from an echocardiogram can provide important information about the size and shape of your heart, heart muscle function, heart valve function, and fluid buildup around your heart.  You do not need to do anything to prepare before this procedure. You may eat and drink normally.  After the echocardiogram is completed, you may return to your normal, everyday life, unless your health care provider tells you not to do that. This information is not intended to replace advice given to you by your health care provider. Make sure you discuss any questions you have with your health care provider. Document Released: 12/11/2000 Document Revised: 01/16/2017 Document Reviewed: 01/16/2017 Elsevier Interactive Patient Education  2019 [employer].

## 2018-12-20 NOTE — Telephone Encounter (Signed)
At OV, patient indicated he was doing better with taking a half tablet of his carvedilol 25mg  twice a day instead of a whole tablet. Therefore the plan was to send in prescription for the 12.5mg  tablet. This was erroneously sent in as 25mg  to take 1/2 tablet BID. I electronically sent in corrected rx for 12.5mg  1 tablet twice daily.  Patient/wife were already aware of plan to use up his current 25mg  tabs (taking half tab BID), then pick up new rx for 12.5mg  tablet to take 1 whole tab BID.   I also called pharmacy to clarify rx - there were no current techs available to take my call so I left message to clarify the dosage. Event monitor order was also placed per check-out's request.  Ronie Spiesayna Ariella Voit PA-C

## 2018-12-20 NOTE — Addendum Note (Signed)
Addended by: Laurann MontanaUNN, Kynleigh Artz N on: 12/20/2018 12:34 PM   Modules accepted: Orders

## 2018-12-26 ENCOUNTER — Ambulatory Visit
Admission: RE | Admit: 2018-12-26 | Discharge: 2018-12-26 | Disposition: A | Discharge status: Home / Self Care | Payer: 59 | Source: Ambulatory Visit | Attending: Thoracic Surgery (Cardiothoracic Vascular Surgery) | Admitting: Thoracic Surgery (Cardiothoracic Vascular Surgery)

## 2018-12-26 ENCOUNTER — Encounter: Payer: Self-pay | Admitting: Thoracic Surgery (Cardiothoracic Vascular Surgery)

## 2018-12-26 ENCOUNTER — Ambulatory Visit (INDEPENDENT_AMBULATORY_CARE_PROVIDER_SITE_OTHER): Payer: Self-pay | Admitting: Thoracic Surgery (Cardiothoracic Vascular Surgery)

## 2018-12-26 ENCOUNTER — Other Ambulatory Visit: Payer: Self-pay

## 2018-12-26 DIAGNOSIS — Z9889 Other specified postprocedural states: Secondary | ICD-10-CM

## 2018-12-26 DIAGNOSIS — I314 Cardiac tamponade: Principal | ICD-10-CM

## 2018-12-26 DIAGNOSIS — I313 Pericardial effusion (noninflammatory): Secondary | ICD-10-CM

## 2018-12-26 NOTE — Patient Instructions (Signed)
Continue all previous medications without any changes at this time  You may begin to taper off of colchicine  Continue to increase walking with goal of 3 walks/day for 15 minutes each walk.  This should continue to gradually increase over time  Continue wound care as previously recommended, packing the old chest tube wound daily.  You may apply some Neosporin/Polysporin ointment daily to the dry eschars (scabs)

## 2018-12-26 NOTE — Progress Notes (Signed)
[address]       [place] [id number]             [phone]     CARDIOTHORACIC SURGERY OFFICE NOTE  Referring Provider is [name], Peter M, MD PCP is Janece CanterburyBoals, Aaron, MD   HPI:  Patient is a morbidly obese 52 year old male with history of hypertension, type 2 diabetes mellitus, hyperlipidemia, and obstructive sleep apnea who returns to the office today for routine follow-up and wound check status post subxiphoid pericardial window on December 07, 2018 for pericarditis with moderate to large sized pericardial effusion and pericardial tamponade.  He was seen in our office last week and noted to have fat necrosis with a wound at the site of the previous chest tube exit site as well as multiple eschars along midline incision without any sign of ongoing infection.  Local wound care was prescribed to include packing of the old chest tube exit site.  Patient returns her office today.  He reports that he is making slow progress.  He admits that he has been apprehensive about trying to walk.  His wife is present and has been caring for his wound.  The subcutaneous tract from that chest tube exit site is not as deep as it was last week.  The dry eschar at the apex of the surgical incision apparently flaked off 2 days ago and there was a small amount of what sounds like serous drainage.  This resolved and a new eschar has formed.  The redness surrounding the lower portion of the incision reportedly continues to improve.  Since hospital discharge the patient's lower extremity edema has continued to decrease.  His weight is now down 30 pounds from where he was at the time of hospital admission.   Current Outpatient Medications  Medication Sig Dispense Refill  . ALPRAZolam (XANAX) 0.5 MG tablet Take 0.5 mg by mouth at bedtime as needed for sleep.  0  . carisoprodol (SOMA) 350 MG tablet Take 350 mg by mouth 2 (two) times daily as needed for muscle spasms.    . carvedilol (COREG) 12.5 MG  tablet Take 1 tablet (12.5 mg total) by mouth 2 (two) times daily. 60 tablet 6  . Colchicine (MITIGARE) 0.6 MG CAPS Take 1 capsule by mouth 2 (two) times daily. 180 capsule 0  . HUMULIN N 100 UNIT/ML injection Inject 20 Units into the skin 2 (two) times daily with a meal.   11  . HUMULIN R 100 UNIT/ML injection Inject 35 Units into the skin 3 (three) times daily with meals.   11  . hydrochlorothiazide (HYDRODIURIL) 25 MG tablet Take 25 mg by mouth daily.    Marland Kitchen. ibuprofen (ADVIL,MOTRIN) 200 MG tablet Take 200 mg by mouth 3 (three) times daily. For 10 days    . losartan (COZAAR) 25 MG tablet Take 25 mg by mouth daily.  6  . Multiple Vitamins-Minerals (MULTIVITAMIN WITH MINERALS) tablet Take 1 tablet by mouth daily.    Marland Kitchen. omeprazole (PRILOSEC) 20 MG capsule Take 20 mg by mouth every other day.    . pioglitazone (ACTOS) 30 MG tablet Take 30 mg by mouth daily.  11  . rosuvastatin (CRESTOR) 10 MG tablet Take 10 mg by mouth every evening.  6  . testosterone cypionate (DEPOTESTOSTERONE CYPIONATE) 200 MG/ML injection Inject 1 mL into the muscle every 14 (fourteen) days.  2  . VENTOLIN HFA 108 (90 Base) MCG/ACT inhaler Inhale 2 puffs into the lungs every 6 (  six) hours as needed for wheezing.  0   No current facility-administered medications for this visit.       Physical Exam:   BP 110/64 (BP Location: Right Arm, Patient Position: Sitting, Cuff Size: Large)   Pulse (!) 107   Resp 18   Ht 5\' 9"  (1.753 m)   Wt (!) 377 lb 9.6 oz (171.3 kg)   SpO2 94% Comment: RA  BMI 55.76 kg/m   General:  Morbidly obese but well-appearing  Chest:   Clear to auscultation  CV:   Regular rate and rhythm  Incisions:  Dry eschar at the apex of midline incision with very small eschars at the inferior aspect, mild surrounding erythema of the skin.  Old chest tube exit site is clean and probes to 4 cm depth which is reportedly improved.  Abdomen:  Extremely obese, nontender  Extremities:  Warm and  well-perfused  Diagnostic Tests:  CHEST - 2 VIEW  COMPARISON:  Portable chest x-ray of December 07, 2018  FINDINGS: The heart is at the upper limits of normal for size but has decreased considerably in volume since the previous study. There is no pleural effusion. The pulmonary vascularity is not engorged. There is no alveolar infiltrate. The trachea is midline. The bony thorax is unremarkable.  IMPRESSION: Top-normal cardiac size much improved since the previous study. No pulmonary edema, pleural effusion, or pneumonia.   Electronically Signed   By: David  [name] M.D.   On: 12/26/2018 16:08    Impression:  Patient is making satisfactory progress following recent subxiphoid pericardial window.  His midline surgical incision appears to be healing without sign of ongoing infection.  There is a dry eschar at the top and some mild redness at the bottom which reportedly is slowly improving.  There is no drainage.  Old chest tube exit site is clean and seems to be slowly healing.  Otherwise the patient appears to be clinically doing well.  Plan:  I have recommended that the patient continue to pack the old chest tube exit site with wound care as prescribed last week.  They may apply Neosporin or Polysporin ointment to the dry eschars.  I have encouraged the patient to make an effort to walk more.  The patient will return to our office for wound check in 3 weeks.  They will call and return sooner if problems develop.    Salvatore Decentlarence H. Cornelius Moraswen, MD 12/26/2018 4:26 PM

## 2018-12-27 ENCOUNTER — Ambulatory Visit (HOSPITAL_COMMUNITY): Payer: 59 | Attending: Cardiology

## 2018-12-27 ENCOUNTER — Other Ambulatory Visit: Payer: Self-pay

## 2018-12-27 DIAGNOSIS — I314 Cardiac tamponade: Secondary | ICD-10-CM | POA: Diagnosis present

## 2018-12-27 DIAGNOSIS — I313 Pericardial effusion (noninflammatory): Secondary | ICD-10-CM

## 2018-12-27 DIAGNOSIS — R609 Edema, unspecified: Secondary | ICD-10-CM

## 2018-12-27 DIAGNOSIS — E785 Hyperlipidemia, unspecified: Secondary | ICD-10-CM

## 2018-12-27 DIAGNOSIS — I10 Essential (primary) hypertension: Secondary | ICD-10-CM | POA: Diagnosis not present

## 2018-12-27 DIAGNOSIS — I3139 Other pericardial effusion (noninflammatory): Secondary | ICD-10-CM

## 2019-01-03 ENCOUNTER — Ambulatory Visit (INDEPENDENT_AMBULATORY_CARE_PROVIDER_SITE_OTHER): Payer: 59

## 2019-01-03 DIAGNOSIS — R002 Palpitations: Secondary | ICD-10-CM | POA: Diagnosis not present

## 2019-01-05 LAB — FUNGUS CULTURE RESULT

## 2019-01-05 LAB — FUNGUS CULTURE WITH STAIN

## 2019-01-05 LAB — FUNGAL ORGANISM REFLEX

## 2019-01-06 ENCOUNTER — Encounter: Payer: Self-pay | Admitting: Physician Assistant

## 2019-01-06 ENCOUNTER — Ambulatory Visit: Payer: 59 | Admitting: Medical

## 2019-01-06 VITALS — BP 134/76 | HR 104 | Ht 69.0 in | Wt 376.0 lb

## 2019-01-06 DIAGNOSIS — I3139 Other pericardial effusion (noninflammatory): Secondary | ICD-10-CM

## 2019-01-06 DIAGNOSIS — I314 Cardiac tamponade: Secondary | ICD-10-CM

## 2019-01-06 DIAGNOSIS — I10 Essential (primary) hypertension: Secondary | ICD-10-CM

## 2019-01-06 DIAGNOSIS — G4733 Obstructive sleep apnea (adult) (pediatric): Secondary | ICD-10-CM

## 2019-01-06 DIAGNOSIS — I313 Pericardial effusion (noninflammatory): Secondary | ICD-10-CM

## 2019-01-06 DIAGNOSIS — E785 Hyperlipidemia, unspecified: Secondary | ICD-10-CM

## 2019-01-06 DIAGNOSIS — Z6841 Body Mass Index (BMI) 40.0 and over, adult: Secondary | ICD-10-CM

## 2019-01-06 DIAGNOSIS — R609 Edema, unspecified: Secondary | ICD-10-CM | POA: Diagnosis not present

## 2019-01-06 NOTE — Progress Notes (Signed)
Cardiology Office Note   Date:  01/07/2019   ID:  Cody Erickson, DOB 09/19/1966, MRN [id number]  PCP:  Janece CanterburyBoals, Aaron, MD  Cardiologist:  Peter [name], MD EP: None  Chief Complaint  Patient presents with  . Follow-up    pericardial effusion      History of Present Illness: Cody Erickson is a 53 y.o. male with a PMH of recent pericardial effusion s/p subxiphoid window 11/2018, chronic LEE, HTN, HLD, DM type 2, OSA on CPAP, and super morbid obesity, who presents for follow-up of pericardial effusion.   Patient was last seen outpatient by cardiology 12/20/18 by Ronie Spiesayna Dunn, PA-C. He was noted to be feeling better since discharge from the hospital but reported some GI issues with colchicine and ibuprofen. He complained of occasional rapid irregular heart rates and was had an event monitor placed 01/03/19 to further evaluate symptoms. [facility] with concerns for a STEMI. LHC revealed mild non-obstructive CAD. CT Chest revealed a moderate pericardial effusion with heterogenous appearance. Echo showed EF 65-70% and moderate pericardial effusion with increase intrapericardial pressure. Given clinical concern for cardiac tamponade and super morbid obesity, patient underwent a subxiphoid pericardial window. No specific inciting source identified. He was discharge home on colchicine and ibuprofen at that time. He was last seen by CT surgery, Dr. Cornelius Moraswen and was instructed to taper off of colchicine at that time and continue packing his chest tube side daily. Repeat echo 12/27/18 showed no evidence of a pericardial effusion.   He returns today for close follow-up of his pericardial effusion. He reports being off of colchicine now for a few days due to GI issues but is still taking ibuprofen 200-400mg  up to three times a day without complications. No complaints of chest pain or SOB. He is trying to build his exercise tolerance but states he feels limited by hip and  back pain. He reports some mild improvement in his LE edema. He is working hard to watch his diet and limit salt intake. He is also being more diligent with wearing his CPAP machine at night. No complaints of dizziness, lightheadedness, syncope, or fevers.     Past Medical History:  Diagnosis Date  . Chronic diastolic CHF (congestive heart failure) (HCC)   . Diabetes mellitus without complication (HCC)   . Hyperlipidemia   . Hypertension   . Morbid obesity (HCC)   . OSA (obstructive sleep apnea)   . Pericardial effusion 12/07/2018   a. s/p subxiphoid window 11/2018.  . Status post creation of pericardial window 12/07/2018    Past Surgical History:  Procedure Laterality Date  . HERNIA REPAIR    . LEFT HEART CATH AND CORONARY ANGIOGRAPHY N/A 12/07/2018   Procedure: LEFT HEART CATH AND CORONARY ANGIOGRAPHY;  Surgeon: [name], Peter M, MD;  [facility];  Service: Cardiovascular;  Laterality: N/A;  . SUBXYPHOID PERICARDIAL WINDOW N/A 12/07/2018   Procedure: SUBXYPHOID PERICARDIAL WINDOW;  Surgeon: Purcell Nailswen, Clarence H, MD;  [facility];  Service: Thoracic;  Laterality: N/A;  . TEE WITHOUT CARDIOVERSION N/A 12/07/2018   Procedure: TRANSESOPHAGEAL ECHOCARDIOGRAM (TEE);  Surgeon: Purcell Nailswen, Clarence H, MD;  [facility];  Service: Thoracic;  Laterality: N/A;     Current Outpatient Medications  Medication Sig Dispense Refill  . ALPRAZolam (XANAX) 0.5 MG tablet Take 0.5 mg by mouth at bedtime as needed for sleep.  0  . carisoprodol (SOMA) 350 MG tablet Take 350 mg by mouth 2 (two) times  daily as needed for muscle spasms.    . carvedilol (COREG) 12.5 MG tablet Take 1 tablet (12.5 mg total) by mouth 2 (two) times daily. 60 tablet 6  . HUMULIN N 100 UNIT/ML injection Inject 20 Units into the skin 2 (two) times daily with a meal.   11  . HUMULIN R 100 UNIT/ML injection Inject 35 Units into the skin 3 (three) times daily with meals.   11  . hydrochlorothiazide (HYDRODIURIL) 25 MG  tablet Take 25 mg by mouth daily.    Marland Kitchen. ibuprofen (ADVIL,MOTRIN) 200 MG tablet Take 200 mg by mouth 3 (three) times daily. For 10 days    . losartan (COZAAR) 25 MG tablet Take 25 mg by mouth daily.  6  . Multiple Vitamins-Minerals (MULTIVITAMIN WITH MINERALS) tablet Take 1 tablet by mouth daily.    Marland Kitchen. omeprazole (PRILOSEC) 20 MG capsule Take 20 mg by mouth every other day.    . pioglitazone (ACTOS) 30 MG tablet Take 30 mg by mouth daily.  11  . rosuvastatin (CRESTOR) 10 MG tablet Take 10 mg by mouth every evening.  6  . testosterone cypionate (DEPOTESTOSTERONE CYPIONATE) 200 MG/ML injection Inject 1 mL into the muscle every 14 (fourteen) days.  2  . VENTOLIN HFA 108 (90 Base) MCG/ACT inhaler Inhale 2 puffs into the lungs every 6 (six) hours as needed for wheezing.  0   No current facility-administered medications for this visit.     Allergies:   Amoxicillin; Metformin and related; Simvastatin-high dose; Canagliflozin; Exenatide; and Gemfibrozil    Social History:  The patient  reports that he has been smoking cigarettes. He has never used smokeless tobacco. He reports previous alcohol use. He reports that he does not use drugs.   Family History:  The patient's family history includes Aortic dissection in his father.    ROS:  Please see the history of present illness.   Otherwise, review of systems are positive for none.   All other systems are reviewed and negative.    PHYSICAL EXAM: VS:  BP 134/76   Pulse (!) 104   Ht 5\' 9"  (1.753 m)   Wt (!) 376 lb (170.6 kg)   BMI 55.53 kg/m  , BMI Body mass index is 55.53 kg/m. GEN: morbidly obese gentleman, in no acute distress HEENT: sclera anicteric Neck: no JVD, carotid bruits, or masses Cardiac: RRR; no murmurs, rubs, or gallops; 1+ LE edema  Respiratory:  clear to auscultation bilaterally, normal work of breathing GI: soft, obese, nontender, nondistended, + BS MS: no deformity or atrophy Skin: warm and dry, no rash. Surgical site with  slight opening closest to xyphoid without erythema or drainage, chest tube site in RUQ with dressing in place with some blood tinged drainage observed - currently packed.  Neuro:  Strength and sensation are intact Psych: euthymic mood, full affect   EKG:  EKG is not ordered today.   Recent Labs: 12/07/2018: B Natriuretic Peptide 11.9; TSH 2.280 12/09/2018: ALT 18; Hemoglobin 14.3; Platelets 212 12/10/2018: BUN 14; Creatinine, Ser 0.74; Potassium 3.9; Sodium 137    Lipid Panel    Component Value Date/Time   CHOL 145 12/07/2018 0526   TRIG 120 12/07/2018 0526   HDL 42 12/07/2018 0526   CHOLHDL 3.5 12/07/2018 0526   VLDL 24 12/07/2018 0526   LDLCALC 79 12/07/2018 0526      Wt Readings from Last 3 Encounters:  01/06/19 (!) 376 lb (170.6 kg)  12/26/18 (!) 377 lb 9.6 oz (171.3 kg)  12/20/18 (!) 385 lb 9.6 oz (174.9 kg)      Other studies Reviewed: Additional studies/ records that were reviewed today include:   Echocardiogram 12/27/18: Study Conclusions  - Left ventricle: The cavity size was normal. There was mild   concentric hypertrophy. Systolic function was normal. The   estimated ejection fraction was in the range of 55% to 60%. Wall   motion was normal; there were no regional wall motion   abnormalities. Left ventricular diastolic function parameters   were normal. - Left atrium: The atrium was moderately dilated. - Right ventricle: The cavity size was mildly dilated. Wall   thickness was normal. Systolic function was reduced.   ASSESSMENT AND PLAN:  1. Pericardial effusion s/p subxiphoid window 11/2018: repeat echo 12/27/18 without evidence of pericardial effusion. He has weaned off of colchicine due to GI issues but is still taking ibuprofen 200-400mg  TID. No recurrent chest pain. Has an appointment with CT surgery 01/16/19 to follow-up on slowly healing chest tube site which he has been packing daily.  - Continue follow-up with CT Surgery - Continue ibuprofen  as tolerated  2. Palpitations: no complaints at visit today. He had an event monitor placed 01/03/19.  - Will follow-up event monitor results - Continue to encourage compliance with CPAP  3. LEE: echo not suggestive of LV dysfunction. More likely edema related to venous insufficiency, lymphedema, and obesity. He reports continued improvement in swelling - Continue hydrochlorothiazide  4. HTN: BP stable today - Continue current regimen  5. HLD: LDL 79 11/2018.  - Continue statin  6. OSA: He reports being more diligent with his CPAP machine over the past few weeks.  - Continue to encourage CPAP compliance  7. Super morbid obesity: he continues to lose weight and is down an additional 9lbs from last visit. He is trying to build his exercise tolerance but states he feels limited by hip and back pain. - Referral to [facility] wellness center made - Recommended pool therapy once surgical sites heal - Continue to encourage dietary modifications to promote weight loss  Palpitations - has event monitor on  Current medicines are reviewed at length with the patient today.  The patient does not have concerns regarding medicines.  The following changes have been made:  no change  Labs/ tests ordered today include: None No orders of the defined types were placed in this encounter.    Disposition:   FU with Dr. Swaziland in 3 months  Signed, Beatriz Stallion, PA-C  01/07/2019 3:32 PM

## 2019-01-06 NOTE — Patient Instructions (Signed)
Medication Instructions:  NO CHANGE If you need a refill on your cardiac medications before your next appointment, please call your pharmacy.   Lab work: If you have labs (blood work) drawn today and your tests are completely normal, you will receive your results only by: Marland Kitchen MyChart Message (if you have MyChart) OR . A paper copy in the mail If you have any lab test that is abnormal or we need to change your treatment, we will call you to review the results.  Follow-Up: At [facility], you and your health needs are our priority.  As part of our continuing mission to provide you with exceptional heart care, we have created designated Provider Care Teams.  These Care Teams include your primary Cardiologist (physician) and Advanced Practice Providers (APPs -  Physician Assistants and Nurse Practitioners) who all work together to provide you with the care you need, when you need it.  Your physician recommends that you schedule a follow-up appointment in: 3 MONTHS WITH DR Sw[place]and  REFERRAL TO [facility] AND Ruthe Mannan

## 2019-01-07 ENCOUNTER — Encounter: Payer: Self-pay | Admitting: Medical

## 2019-01-11 ENCOUNTER — Telehealth: Payer: Self-pay | Admitting: Cardiology

## 2019-01-11 NOTE — Telephone Encounter (Signed)
I was called by preventives heart monitoring company to report that patient had been in sinus rhythm in the 70s with PACs and developed atrial flutter with rates 120s to 140s at 0616 this morning.  This has been the first occurrence of atrial flutter.  They tried to call the patient and he did not answer.  Patient has a history of pericardial effusion/window.  His This patients CHA2DS2-VASc Score is at least 2.  He has been taking ibuprofen for pericardial effusion and not sure if he is a candidate for current anticoagulation. I called the patient and he says that he awoke this morning feeling like he was in a bad dream with his heart racing.  He does not recall any specific shortness of breath or chest pain although he says his chest was uncomfortable.  Currently he has no symptoms and reports that his pulse is regular and normal rate.   I will route this message to Dr. Swaziland and Anabel Halon, his nurse for management of A. fib and consideration of anticoagulation.  The monitoring company is transmitting the strips to the office.

## 2019-01-11 NOTE — Telephone Encounter (Signed)
Spoke to patient Dr.Jordan's recommendation given.Stated he has not noticed heart racing since episode this morning.Advised to continue to wear monitor.Advised to call back if he has any more racing heart beat.

## 2019-01-11 NOTE — Telephone Encounter (Signed)
Given recent hemorrhagic pericarditis he should not be anticoagulated. Will monitor for now.   Louie Meaders Swaziland MD, [facility]

## 2019-01-16 ENCOUNTER — Other Ambulatory Visit: Payer: Self-pay

## 2019-01-16 ENCOUNTER — Ambulatory Visit (INDEPENDENT_AMBULATORY_CARE_PROVIDER_SITE_OTHER): Payer: Self-pay | Admitting: Physician Assistant

## 2019-01-16 VITALS — BP 126/64 | HR 95 | Resp 16 | Ht 69.0 in | Wt 374.1 lb

## 2019-01-16 DIAGNOSIS — I314 Cardiac tamponade: Secondary | ICD-10-CM

## 2019-01-16 DIAGNOSIS — I3139 Other pericardial effusion (noninflammatory): Secondary | ICD-10-CM

## 2019-01-16 DIAGNOSIS — I313 Pericardial effusion (noninflammatory): Secondary | ICD-10-CM

## 2019-01-16 DIAGNOSIS — Z09 Encounter for follow-up examination after completed treatment for conditions other than malignant neoplasm: Secondary | ICD-10-CM

## 2019-01-16 NOTE — Progress Notes (Signed)
[address]       [place] [address]             [phone]      Anav Hecht is a 53 y.o. male patient who underwent a subxiphoid pericardial window on December 07, 2018 for pericarditis with a moderate to large size pericardial effusion and pericardial tamponade.  He was last seen in our office on 12/26/2018 for a wound check.  He was noted to have some fat necrosis with an open wound at the site of previous chest tube exit site.  He has been performing local wound care at home and packing of the old chest tube exit site.  Patient returns to the office today for a 3-week check.   1. Pericardial effusion with cardiac tamponade   2. Surgery follow-up    Past Medical History:  Diagnosis Date  . Chronic diastolic CHF (congestive heart failure) (HCC)   . Diabetes mellitus without complication (HCC)   . Hyperlipidemia   . Hypertension   . Morbid obesity (HCC)   . OSA (obstructive sleep apnea)   . Pericardial effusion 12/07/2018   a. s/p subxiphoid window 11/2018.  . Status post creation of pericardial window 12/07/2018   No past surgical history pertinent negatives on file.    Scheduled Meds: Current Outpatient Medications on File Prior to Visit  Medication Sig Dispense Refill  . ALPRAZolam (XANAX) 0.5 MG tablet Take 0.5 mg by mouth at bedtime as needed for sleep.  0  . carisoprodol (SOMA) 350 MG tablet Take 350 mg by mouth 2 (two) times daily as needed for muscle spasms.    . carvedilol (COREG) 12.5 MG tablet Take 1 tablet (12.5 mg total) by mouth 2 (two) times daily. 60 tablet 6  . HUMULIN N 100 UNIT/ML injection Inject 20 Units into the skin 2 (two) times daily with a meal.   11  . HUMULIN R 100 UNIT/ML injection Inject 35 Units into the skin 3 (three) times daily with meals.   11  . hydrochlorothiazide (HYDRODIURIL) 25 MG tablet Take 25 mg by mouth daily.    Marland Kitchen ibuprofen (ADVIL,MOTRIN) 200 MG tablet Take 200 mg by mouth 3 (three) times daily. For 10  days    . losartan (COZAAR) 25 MG tablet Take 25 mg by mouth daily.  6  . Multiple Vitamins-Minerals (MULTIVITAMIN WITH MINERALS) tablet Take 1 tablet by mouth daily.    Marland Kitchen omeprazole (PRILOSEC) 20 MG capsule Take 20 mg by mouth every other day.    . pioglitazone (ACTOS) 30 MG tablet Take 30 mg by mouth daily.  11  . rosuvastatin (CRESTOR) 10 MG tablet Take 10 mg by mouth every evening.  6  . testosterone cypionate (DEPOTESTOSTERONE CYPIONATE) 200 MG/ML injection Inject 1 mL into the muscle every 14 (fourteen) days.  2  . VENTOLIN HFA 108 (90 Base) MCG/ACT inhaler Inhale 2 puffs into the lungs every 6 (six) hours as needed for wheezing.  0   No current facility-administered medications on file prior to visit.     Allergies  Allergen Reactions  . Amoxicillin Hives  . Metformin And Related     sick  . Simvastatin-High Dose     Elevated LFTs  . Canagliflozin Diarrhea    (invokana)  . Exenatide Nausea Only    (byetta)  . Gemfibrozil Other (See Comments)    Blood pressure 126/64, pulse 95, resp. rate 16, height 5\' 9"  (1.753 m), weight (!) 169.7 kg,  SpO2 95 %.  Subjective patient presents today for a chest tube site wound check.  Objective:   Cor: RRR, no murmur Pulm: CTA bilaterally and in all fields Abd: no tenderness Wound: right chest tube exit site is 3.5cm deep. Blood tinged drainage. Packed today with a wick and dry 4 x 4 gauze Ext: 1-2 + nonpitting edema  No chest xray ordered for today   Assessment & Plan   Mr. Bangerter presents today for a right chest tube exit site wound check.  He was here about 3 weeks ago and saw Dr. Cornelius Moras and at that time the wound had been healing well.  On today's exam the chest tube exit site is about 3.5 cm deep and about 1 cm in width.  A sterile wick was applied into the wound and the exterior was dressed with a dry 4 x 4 sterile gauze dressing and tape.  The pericardial window incision is healing much better without any drainage however there  remains some eschar on the upper portion of the incision and along to the lower portion.  According to the patient, this has been slowly flaking off every time he does a sponge bath.  He has not taken a full shower since surgery due to his open wound.  He also now has a loop recorder that was placed externally to monitor his rhythm.  He is only had few episodes of arrhythmias with a rate in the 140s.  Cardiology has been looking after this loop recorder and providing guidance.  He states that his weight has been slowly decreasing.  At the appointment today he weighed in at 376 lbs.  At home he has weighed much less.  He has been keeping track of his blood glucose level closely and shares that it has been actually low at times.  I stressed the importance of keeping tight glycemic control while his wounds are healing.  Overall, I do think that he is progressing from a wound standpoint.  His wife has been doing all the wound changes at home.  He was told not to drive upon last appointment due to some active drainage of his incision.  Today, his pericardial incision is not draining any fluid and I think it safe for him to drive short distances as long as it is okay with cardiology.  I still do not advise driving long distances and if he is able to work from home like he has been the last few weeks that would be my recommendation.  I have scheduled an appointment for him in 3 weeks to see Dr. Cornelius Moras or 1 of the PAs to monitor his progress and at that time he may be cleared to go back to work if he continues to make adequate progress.  The patient and his family is encouraged to call our office if there are any further questions or concerns otherwise, we will see him in a few weeks for another wound check.  Sharlene Dory 01/16/2019

## 2019-01-20 LAB — ACID FAST CULTURE WITH REFLEXED SENSITIVITIES (MYCOBACTERIA): Acid Fast Culture: NEGATIVE

## 2019-02-06 ENCOUNTER — Encounter: Payer: Self-pay | Admitting: Thoracic Surgery (Cardiothoracic Vascular Surgery)

## 2019-02-06 ENCOUNTER — Ambulatory Visit (INDEPENDENT_AMBULATORY_CARE_PROVIDER_SITE_OTHER): Payer: Self-pay | Admitting: Thoracic Surgery (Cardiothoracic Vascular Surgery)

## 2019-02-06 ENCOUNTER — Other Ambulatory Visit: Payer: Self-pay

## 2019-02-06 VITALS — BP 142/87 | HR 104 | Resp 18 | Ht 69.0 in | Wt 371.2 lb

## 2019-02-06 DIAGNOSIS — Z9889 Other specified postprocedural states: Secondary | ICD-10-CM

## 2019-02-06 NOTE — Progress Notes (Signed)
[address]       [place] [address]             [phone]     CARDIOTHORACIC SURGERY OFFICE NOTE  Referring Provider is [place], Peter M, MD PCP is Janece Canterbury, MD   HPI:  Patient is a morbidly obese 53 year old male with hypertension, type 2 diabetes mellitus, hyperlipidemia, and obstructive sleep apnea who returns to the office today for wound check having originally undergone subxiphoid pericardial window on December 07, 2018 for pericarditis with a moderate to large sized pericardial effusion.  He developed some fat necrosis from his surgical incision and from his chest tube exit site which was treated with local wound care.  He was last seen here in our office on January 16, 2019 at which time he was making good progress.  He returns her office today and reports that his wounds have essentially healed completely.  He no longer has any significant pain.  He has not had fevers or chills.   Current Outpatient Medications  Medication Sig Dispense Refill  . ALPRAZolam (XANAX) 0.5 MG tablet Take 0.5 mg by mouth at bedtime as needed for sleep.  0  . carisoprodol (SOMA) 350 MG tablet Take 350 mg by mouth 2 (two) times daily as needed for muscle spasms.    . carvedilol (COREG) 12.5 MG tablet Take 1 tablet (12.5 mg total) by mouth 2 (two) times daily. 60 tablet 6  . HUMULIN N 100 UNIT/ML injection Inject 20 Units into the skin 2 (two) times daily with a meal.   11  . HUMULIN R 100 UNIT/ML injection Inject 35 Units into the skin 3 (three) times daily with meals.   11  . hydrochlorothiazide (HYDRODIURIL) 25 MG tablet Take 25 mg by mouth daily.    Marland Kitchen ibuprofen (ADVIL,MOTRIN) 200 MG tablet Take 200 mg by mouth 3 (three) times daily. For 10 days    . losartan (COZAAR) 25 MG tablet Take 25 mg by mouth daily.  6  . Multiple Vitamins-Minerals (MULTIVITAMIN WITH MINERALS) tablet Take 1 tablet by mouth daily.    Marland Kitchen omeprazole (PRILOSEC) 20 MG capsule Take 20 mg by mouth every  other day.    . pioglitazone (ACTOS) 30 MG tablet Take 30 mg by mouth daily.  11  . rosuvastatin (CRESTOR) 10 MG tablet Take 10 mg by mouth every evening.  6  . testosterone cypionate (DEPOTESTOSTERONE CYPIONATE) 200 MG/ML injection Inject 1 mL into the muscle every 14 (fourteen) days.  2  . VENTOLIN HFA 108 (90 Base) MCG/ACT inhaler Inhale 2 puffs into the lungs every 6 (six) hours as needed for wheezing.  0   No current facility-administered medications for this visit.       Physical Exam:   BP (!) 142/87 (BP Location: Right Arm, Patient Position: Sitting, Cuff Size: Large)   Pulse (!) 104   Resp 18   Ht 5\' 9"  (1.753 m)   Wt (!) 371 lb 3.2 oz (168.4 kg)   SpO2 93% Comment: RA  BMI 54.82 kg/m   General:  Morbidly obese but well-appearing  Chest:   Clear to auscultation  CV:   Regular rate and rhythm without murmur  Incisions:  Completely healed with dry eschar at chest tube exit site  Abdomen:  Soft and nontender  Extremities:  Warm and well-perfused  Diagnostic Tests:  n/a   Impression:  Patient's surgical incisions have essentially healed completely  Plan:  Patient has been  advised that he may continue to increase his physical activity without any particular limitations.  He will return to our office only should further problems or questions arise.    Salvatore Decentlarence H. Cornelius Moraswen, MD 02/06/2019 11:14 AM

## 2019-02-06 NOTE — Patient Instructions (Signed)
Continue all previous medications without any changes at this time  

## 2019-02-10 ENCOUNTER — Telehealth: Payer: Self-pay

## 2019-02-10 NOTE — Telephone Encounter (Signed)
Spoke to patient Dr.Jordan advised he does not need to take aspirin 81 mg daily.

## 2019-04-07 ENCOUNTER — Ambulatory Visit: Payer: 59 | Admitting: Cardiology

## 2019-07-27 ENCOUNTER — Other Ambulatory Visit: Payer: Self-pay | Admitting: Physician Assistant

## 2019-12-30 ENCOUNTER — Other Ambulatory Visit: Payer: Self-pay | Admitting: Physician Assistant

## 2020-08-08 IMAGING — DX DG CHEST 1V PORT
1 series · 1 of 1 positions shown · non-contrast
Comparison: CT chest 12/07/2018

CLINICAL DATA: Postprocedure

EXAM:
PORTABLE CHEST 1 VIEW

[chest ap]
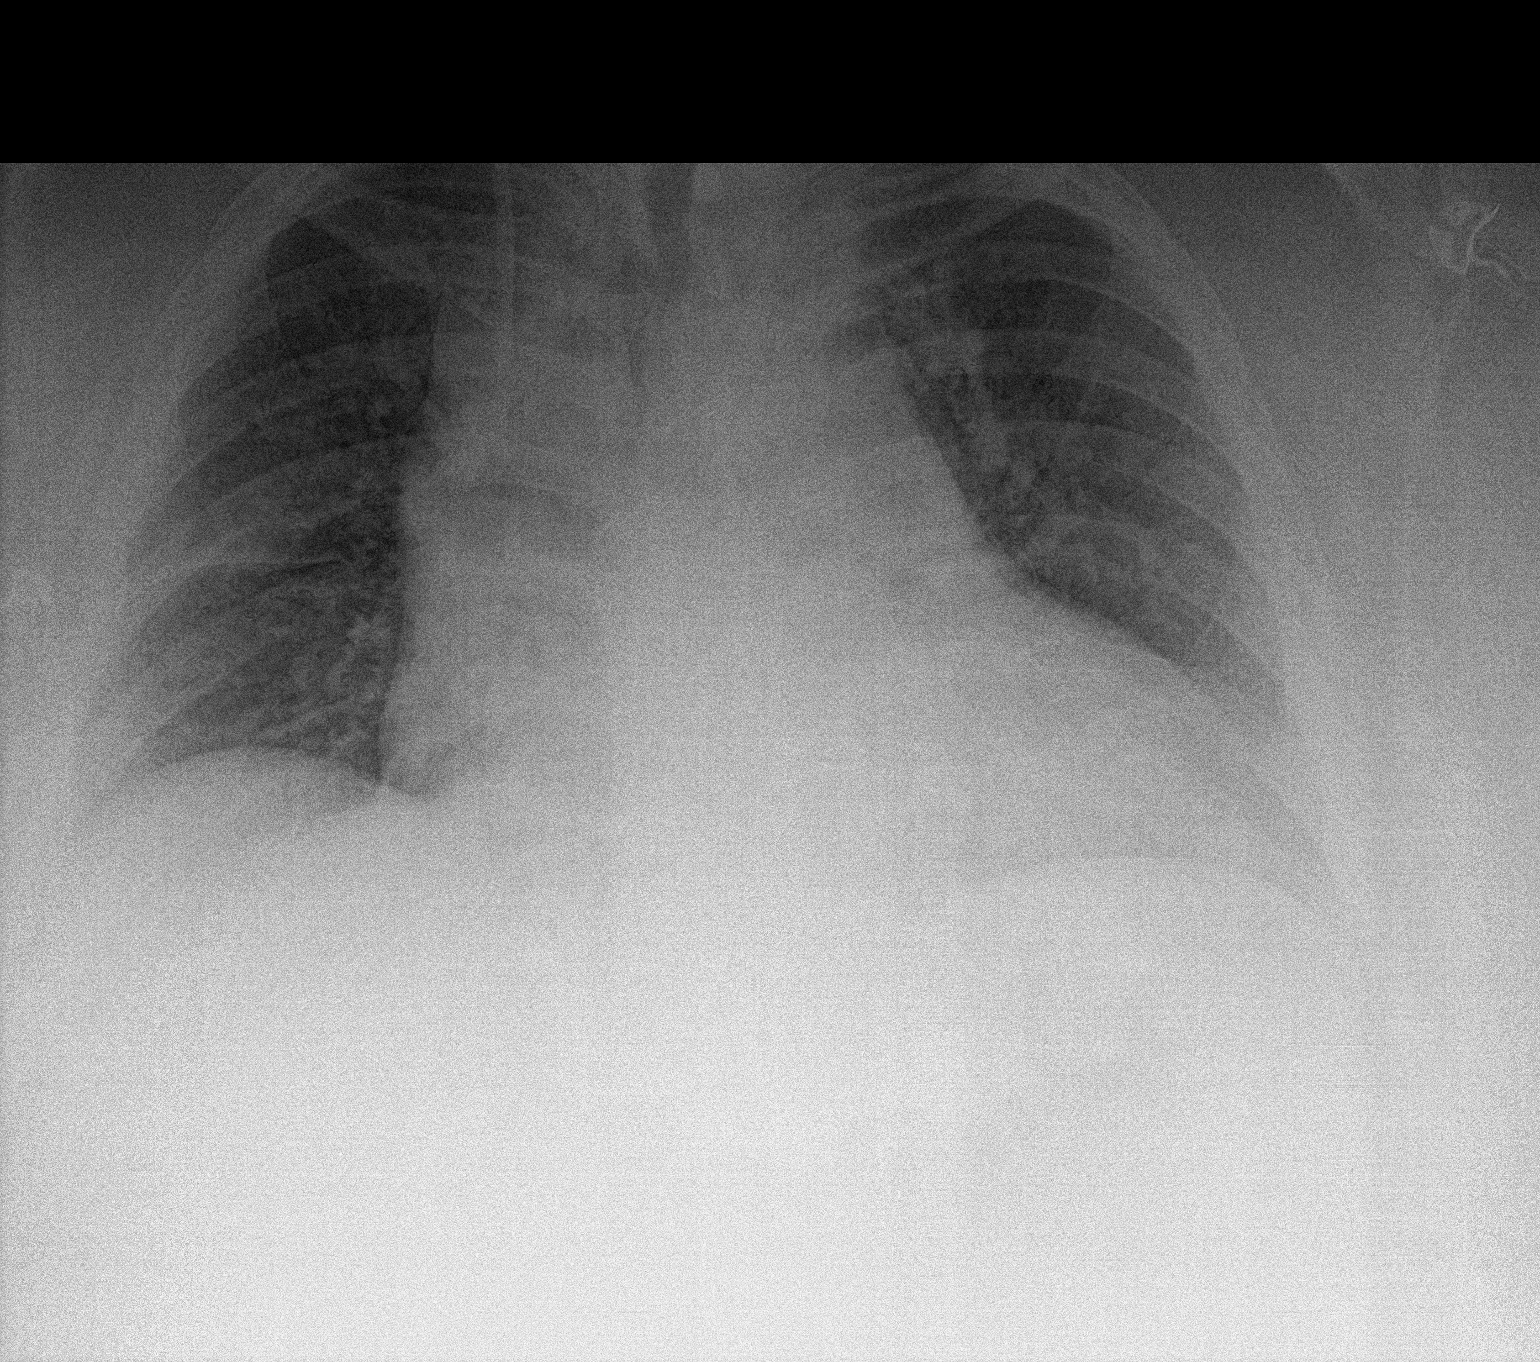

[1 of 1 positions shown; findings below may reference images not displayed]

FINDINGS: Limited by body habitus. Right-sided central venous catheter tip
poorly visible, likely over the SVC. No gross pneumothorax.
Cardiomegaly without edema.
IMPRESSION: 1. Limited by habitus. There appears to be right central venous
catheter, the tip is poorly visible but suspected to overlie the SVC
region. No gross pneumothorax
2. Cardiomegaly.  Low lung volume.
